# Patient Record
Sex: Male | Born: 1945
Health system: Southern US, Community
[De-identification: ages and names within clinical notes are randomized; demographics above are authoritative.]

## PROBLEM LIST (undated history)

## (undated) DIAGNOSIS — I10 Essential (primary) hypertension: Secondary | ICD-10-CM

## (undated) HISTORY — DX: Essential (primary) hypertension: I10

---

## 2004-10-28 ENCOUNTER — Encounter: Admission: RE | Admit: 2004-10-28 | Discharge: 2004-10-28 | Payer: Self-pay | Admitting: Gastroenterology

## 2009-11-16 ENCOUNTER — Encounter: Admission: RE | Admit: 2009-11-16 | Discharge: 2009-11-16 | Payer: Self-pay | Admitting: Family Medicine

## 2009-11-23 ENCOUNTER — Encounter: Admission: RE | Admit: 2009-11-23 | Discharge: 2009-11-23 | Payer: Self-pay | Admitting: Neurosurgery

## 2009-12-21 ENCOUNTER — Encounter: Admission: RE | Admit: 2009-12-21 | Discharge: 2009-12-21 | Payer: Self-pay | Admitting: Neurosurgery

## 2010-01-29 ENCOUNTER — Encounter: Admission: RE | Admit: 2010-01-29 | Discharge: 2010-01-29 | Payer: Self-pay | Admitting: Neurosurgery

## 2010-05-07 ENCOUNTER — Observation Stay (HOSPITAL_COMMUNITY): Admission: RE | Admit: 2010-05-07 | Discharge: 2010-05-08 | Payer: Self-pay | Admitting: Neurosurgery

## 2010-06-18 ENCOUNTER — Emergency Department (HOSPITAL_COMMUNITY): Admission: EM | Admit: 2010-06-18 | Discharge: 2010-06-19 | Payer: Self-pay | Admitting: Emergency Medicine

## 2010-06-20 ENCOUNTER — Ambulatory Visit (HOSPITAL_COMMUNITY): Admission: RE | Admit: 2010-06-20 | Discharge: 2010-06-20 | Payer: Self-pay | Admitting: Gastroenterology

## 2010-12-26 LAB — CBC
Hemoglobin: 13 g/dL (ref 13.0–17.0)
MCH: 29.5 pg (ref 26.0–34.0)
MCHC: 34.9 g/dL (ref 30.0–36.0)
Platelets: 286 10*3/uL (ref 150–400)
RDW: 12.4 % (ref 11.5–15.5)

## 2010-12-26 LAB — PROTIME-INR
INR: 0.95 (ref 0.00–1.49)
Prothrombin Time: 12.9 seconds (ref 11.6–15.2)

## 2010-12-26 LAB — COMPREHENSIVE METABOLIC PANEL
AST: 21 U/L (ref 0–37)
CO2: 28 mEq/L (ref 19–32)
Calcium: 9.3 mg/dL (ref 8.4–10.5)
Creatinine, Ser: 0.95 mg/dL (ref 0.4–1.5)
GFR calc Af Amer: >60 mL/min (ref >60)
GFR calc non Af Amer: >60 mL/min (ref >60)
Total Protein: 6.3 g/dL (ref 6.0–8.3)

## 2010-12-26 LAB — GLUCOSE, CAPILLARY: Glucose-Capillary: 242 mg/dL — ABNORMAL HIGH (ref 70–99)

## 2010-12-28 LAB — CBC
HCT: 41 % (ref 39.0–52.0)
Hemoglobin: 13.9 g/dL (ref 13.0–17.0)
MCH: 32 pg (ref 26.0–34.0)
MCHC: 33.9 g/dL (ref 30.0–36.0)
RDW: 12.7 % (ref 11.5–15.5)

## 2010-12-28 LAB — COMPREHENSIVE METABOLIC PANEL
ALT: 34 U/L (ref 0–53)
CO2: 28 mEq/L (ref 19–32)
Calcium: 9.7 mg/dL (ref 8.4–10.5)
Creatinine, Ser: 1.04 mg/dL (ref 0.4–1.5)
GFR calc non Af Amer: >60 mL/min (ref >60)
Glucose, Bld: 113 mg/dL — ABNORMAL HIGH (ref 70–99)
Sodium: 138 mEq/L (ref 135–145)
Total Protein: 6.7 g/dL (ref 6.0–8.3)

## 2010-12-28 LAB — GLUCOSE, CAPILLARY
Glucose-Capillary: 149 mg/dL — ABNORMAL HIGH (ref 70–99)
Glucose-Capillary: 187 mg/dL — ABNORMAL HIGH (ref 70–99)

## 2010-12-28 LAB — APTT: aPTT: 26 seconds (ref 24–37)

## 2010-12-28 LAB — PROTIME-INR
INR: 0.93 (ref 0.00–1.49)
Prothrombin Time: 12.4 seconds (ref 11.6–15.2)

## 2010-12-28 LAB — DIFFERENTIAL
Eosinophils Absolute: 0.2 10*3/uL (ref 0.0–0.7)
Lymphocytes Relative: 16 % (ref 12–46)
Lymphs Abs: 1.3 10*3/uL (ref 0.7–4.0)
Monocytes Relative: 8 % (ref 3–12)
Neutro Abs: 6.1 10*3/uL (ref 1.7–7.7)
Neutrophils Relative %: 73 % (ref 43–77)

## 2010-12-28 LAB — URINALYSIS, ROUTINE W REFLEX MICROSCOPIC
Bilirubin Urine: NEGATIVE
Ketones, ur: NEGATIVE mg/dL
Nitrite: NEGATIVE
Protein, ur: NEGATIVE mg/dL
pH: 5.5 (ref 5.0–8.0)

## 2010-12-28 LAB — SURGICAL PCR SCREEN
MRSA, PCR: NEGATIVE
Staphylococcus aureus: NEGATIVE

## 2011-07-09 DIAGNOSIS — I10 Essential (primary) hypertension: Secondary | ICD-10-CM | POA: Insufficient documentation

## 2011-07-09 DIAGNOSIS — F909 Attention-deficit hyperactivity disorder, unspecified type: Secondary | ICD-10-CM | POA: Insufficient documentation

## 2011-10-15 DIAGNOSIS — I10 Essential (primary) hypertension: Secondary | ICD-10-CM | POA: Diagnosis not present

## 2011-10-15 DIAGNOSIS — E119 Type 2 diabetes mellitus without complications: Secondary | ICD-10-CM | POA: Diagnosis not present

## 2011-10-15 DIAGNOSIS — Z125 Encounter for screening for malignant neoplasm of prostate: Secondary | ICD-10-CM | POA: Diagnosis not present

## 2011-10-15 DIAGNOSIS — Z0000 Encounter for general adult medical examination without abnormal findings: Secondary | ICD-10-CM | POA: Diagnosis not present

## 2011-10-15 DIAGNOSIS — E785 Hyperlipidemia, unspecified: Secondary | ICD-10-CM | POA: Diagnosis not present

## 2011-10-22 DIAGNOSIS — R079 Chest pain, unspecified: Secondary | ICD-10-CM | POA: Diagnosis not present

## 2011-10-22 DIAGNOSIS — I10 Essential (primary) hypertension: Secondary | ICD-10-CM | POA: Diagnosis not present

## 2011-10-22 DIAGNOSIS — Z125 Encounter for screening for malignant neoplasm of prostate: Secondary | ICD-10-CM | POA: Diagnosis not present

## 2011-10-22 DIAGNOSIS — J309 Allergic rhinitis, unspecified: Secondary | ICD-10-CM | POA: Diagnosis not present

## 2011-10-22 DIAGNOSIS — Z0000 Encounter for general adult medical examination without abnormal findings: Secondary | ICD-10-CM | POA: Diagnosis not present

## 2011-10-22 DIAGNOSIS — N529 Male erectile dysfunction, unspecified: Secondary | ICD-10-CM | POA: Insufficient documentation

## 2011-12-23 DIAGNOSIS — J209 Acute bronchitis, unspecified: Secondary | ICD-10-CM | POA: Diagnosis not present

## 2012-02-02 DIAGNOSIS — B351 Tinea unguium: Secondary | ICD-10-CM | POA: Diagnosis not present

## 2012-02-02 DIAGNOSIS — E119 Type 2 diabetes mellitus without complications: Secondary | ICD-10-CM | POA: Diagnosis not present

## 2012-02-02 DIAGNOSIS — I10 Essential (primary) hypertension: Secondary | ICD-10-CM | POA: Diagnosis not present

## 2012-02-02 DIAGNOSIS — E785 Hyperlipidemia, unspecified: Secondary | ICD-10-CM | POA: Diagnosis not present

## 2012-02-02 DIAGNOSIS — N400 Enlarged prostate without lower urinary tract symptoms: Secondary | ICD-10-CM | POA: Insufficient documentation

## 2012-02-17 DIAGNOSIS — M2020 Hallux rigidus, unspecified foot: Secondary | ICD-10-CM | POA: Diagnosis not present

## 2012-02-20 DIAGNOSIS — N529 Male erectile dysfunction, unspecified: Secondary | ICD-10-CM | POA: Diagnosis not present

## 2012-02-20 DIAGNOSIS — E291 Testicular hypofunction: Secondary | ICD-10-CM | POA: Diagnosis not present

## 2012-05-19 DIAGNOSIS — M1990 Unspecified osteoarthritis, unspecified site: Secondary | ICD-10-CM | POA: Diagnosis not present

## 2012-05-19 DIAGNOSIS — E119 Type 2 diabetes mellitus without complications: Secondary | ICD-10-CM | POA: Diagnosis not present

## 2012-05-19 DIAGNOSIS — N529 Male erectile dysfunction, unspecified: Secondary | ICD-10-CM | POA: Diagnosis not present

## 2012-05-19 DIAGNOSIS — N400 Enlarged prostate without lower urinary tract symptoms: Secondary | ICD-10-CM | POA: Diagnosis not present

## 2012-06-29 DIAGNOSIS — M79609 Pain in unspecified limb: Secondary | ICD-10-CM | POA: Diagnosis not present

## 2012-06-29 DIAGNOSIS — M2020 Hallux rigidus, unspecified foot: Secondary | ICD-10-CM | POA: Diagnosis not present

## 2012-10-28 DIAGNOSIS — Z125 Encounter for screening for malignant neoplasm of prostate: Secondary | ICD-10-CM | POA: Diagnosis not present

## 2012-10-28 DIAGNOSIS — I10 Essential (primary) hypertension: Secondary | ICD-10-CM | POA: Diagnosis not present

## 2012-10-28 DIAGNOSIS — E785 Hyperlipidemia, unspecified: Secondary | ICD-10-CM | POA: Diagnosis not present

## 2012-10-28 DIAGNOSIS — E119 Type 2 diabetes mellitus without complications: Secondary | ICD-10-CM | POA: Diagnosis not present

## 2012-10-29 DIAGNOSIS — E785 Hyperlipidemia, unspecified: Secondary | ICD-10-CM | POA: Diagnosis not present

## 2012-12-27 DIAGNOSIS — Z1212 Encounter for screening for malignant neoplasm of rectum: Secondary | ICD-10-CM | POA: Diagnosis not present

## 2012-12-27 DIAGNOSIS — Z125 Encounter for screening for malignant neoplasm of prostate: Secondary | ICD-10-CM | POA: Diagnosis not present

## 2012-12-27 DIAGNOSIS — J309 Allergic rhinitis, unspecified: Secondary | ICD-10-CM | POA: Diagnosis not present

## 2012-12-27 DIAGNOSIS — N529 Male erectile dysfunction, unspecified: Secondary | ICD-10-CM | POA: Diagnosis not present

## 2012-12-27 DIAGNOSIS — Z0000 Encounter for general adult medical examination without abnormal findings: Secondary | ICD-10-CM | POA: Diagnosis not present

## 2012-12-27 DIAGNOSIS — M1990 Unspecified osteoarthritis, unspecified site: Secondary | ICD-10-CM | POA: Diagnosis not present

## 2012-12-27 DIAGNOSIS — E119 Type 2 diabetes mellitus without complications: Secondary | ICD-10-CM | POA: Diagnosis not present

## 2012-12-27 DIAGNOSIS — F909 Attention-deficit hyperactivity disorder, unspecified type: Secondary | ICD-10-CM | POA: Diagnosis not present

## 2012-12-27 DIAGNOSIS — E785 Hyperlipidemia, unspecified: Secondary | ICD-10-CM | POA: Diagnosis not present

## 2013-04-18 DIAGNOSIS — B0229 Other postherpetic nervous system involvement: Secondary | ICD-10-CM | POA: Diagnosis not present

## 2013-04-18 DIAGNOSIS — B029 Zoster without complications: Secondary | ICD-10-CM | POA: Diagnosis not present

## 2013-04-18 DIAGNOSIS — E119 Type 2 diabetes mellitus without complications: Secondary | ICD-10-CM | POA: Diagnosis not present

## 2013-04-18 DIAGNOSIS — I10 Essential (primary) hypertension: Secondary | ICD-10-CM | POA: Diagnosis not present

## 2013-04-18 DIAGNOSIS — IMO0002 Reserved for concepts with insufficient information to code with codable children: Secondary | ICD-10-CM | POA: Diagnosis not present

## 2013-11-08 DIAGNOSIS — J309 Allergic rhinitis, unspecified: Secondary | ICD-10-CM | POA: Diagnosis not present

## 2013-11-08 DIAGNOSIS — J069 Acute upper respiratory infection, unspecified: Secondary | ICD-10-CM | POA: Diagnosis not present

## 2013-11-08 DIAGNOSIS — IMO0002 Reserved for concepts with insufficient information to code with codable children: Secondary | ICD-10-CM | POA: Diagnosis not present

## 2013-12-20 DIAGNOSIS — E785 Hyperlipidemia, unspecified: Secondary | ICD-10-CM | POA: Diagnosis not present

## 2013-12-20 DIAGNOSIS — E1169 Type 2 diabetes mellitus with other specified complication: Secondary | ICD-10-CM | POA: Diagnosis not present

## 2013-12-20 DIAGNOSIS — Z125 Encounter for screening for malignant neoplasm of prostate: Secondary | ICD-10-CM | POA: Diagnosis not present

## 2013-12-29 DIAGNOSIS — E785 Hyperlipidemia, unspecified: Secondary | ICD-10-CM | POA: Diagnosis not present

## 2013-12-29 DIAGNOSIS — N529 Male erectile dysfunction, unspecified: Secondary | ICD-10-CM | POA: Diagnosis not present

## 2013-12-29 DIAGNOSIS — N400 Enlarged prostate without lower urinary tract symptoms: Secondary | ICD-10-CM | POA: Diagnosis not present

## 2013-12-29 DIAGNOSIS — Z1331 Encounter for screening for depression: Secondary | ICD-10-CM | POA: Diagnosis not present

## 2013-12-29 DIAGNOSIS — F909 Attention-deficit hyperactivity disorder, unspecified type: Secondary | ICD-10-CM | POA: Diagnosis not present

## 2013-12-29 DIAGNOSIS — I10 Essential (primary) hypertension: Secondary | ICD-10-CM | POA: Diagnosis not present

## 2013-12-29 DIAGNOSIS — E1169 Type 2 diabetes mellitus with other specified complication: Secondary | ICD-10-CM | POA: Diagnosis not present

## 2013-12-29 DIAGNOSIS — Z1212 Encounter for screening for malignant neoplasm of rectum: Secondary | ICD-10-CM | POA: Diagnosis not present

## 2013-12-29 DIAGNOSIS — Z0000 Encounter for general adult medical examination without abnormal findings: Secondary | ICD-10-CM | POA: Diagnosis not present

## 2013-12-29 DIAGNOSIS — M1990 Unspecified osteoarthritis, unspecified site: Secondary | ICD-10-CM | POA: Diagnosis not present

## 2013-12-29 DIAGNOSIS — K219 Gastro-esophageal reflux disease without esophagitis: Secondary | ICD-10-CM | POA: Insufficient documentation

## 2013-12-29 DIAGNOSIS — Z125 Encounter for screening for malignant neoplasm of prostate: Secondary | ICD-10-CM | POA: Diagnosis not present

## 2014-07-11 DIAGNOSIS — Z6831 Body mass index (BMI) 31.0-31.9, adult: Secondary | ICD-10-CM | POA: Diagnosis not present

## 2014-07-11 DIAGNOSIS — M1990 Unspecified osteoarthritis, unspecified site: Secondary | ICD-10-CM | POA: Diagnosis not present

## 2014-07-11 DIAGNOSIS — R319 Hematuria, unspecified: Secondary | ICD-10-CM | POA: Diagnosis not present

## 2014-07-11 DIAGNOSIS — E1169 Type 2 diabetes mellitus with other specified complication: Secondary | ICD-10-CM | POA: Diagnosis not present

## 2014-07-11 DIAGNOSIS — Z23 Encounter for immunization: Secondary | ICD-10-CM | POA: Diagnosis not present

## 2014-07-11 DIAGNOSIS — I10 Essential (primary) hypertension: Secondary | ICD-10-CM | POA: Diagnosis not present

## 2014-07-11 DIAGNOSIS — K625 Hemorrhage of anus and rectum: Secondary | ICD-10-CM | POA: Diagnosis not present

## 2014-08-29 DIAGNOSIS — R300 Dysuria: Secondary | ICD-10-CM | POA: Diagnosis not present

## 2014-08-29 DIAGNOSIS — K219 Gastro-esophageal reflux disease without esophagitis: Secondary | ICD-10-CM | POA: Diagnosis not present

## 2014-08-29 DIAGNOSIS — K921 Melena: Secondary | ICD-10-CM | POA: Diagnosis not present

## 2014-10-10 DIAGNOSIS — J069 Acute upper respiratory infection, unspecified: Secondary | ICD-10-CM | POA: Diagnosis not present

## 2015-03-05 DIAGNOSIS — K640 First degree hemorrhoids: Secondary | ICD-10-CM | POA: Diagnosis not present

## 2015-03-05 DIAGNOSIS — Z1211 Encounter for screening for malignant neoplasm of colon: Secondary | ICD-10-CM | POA: Diagnosis not present

## 2015-03-05 DIAGNOSIS — K219 Gastro-esophageal reflux disease without esophagitis: Secondary | ICD-10-CM | POA: Diagnosis not present

## 2015-03-05 DIAGNOSIS — R12 Heartburn: Secondary | ICD-10-CM | POA: Diagnosis not present

## 2015-03-05 DIAGNOSIS — K5730 Diverticulosis of large intestine without perforation or abscess without bleeding: Secondary | ICD-10-CM | POA: Diagnosis not present

## 2015-04-24 DIAGNOSIS — E785 Hyperlipidemia, unspecified: Secondary | ICD-10-CM | POA: Diagnosis not present

## 2015-04-24 DIAGNOSIS — E119 Type 2 diabetes mellitus without complications: Secondary | ICD-10-CM | POA: Diagnosis not present

## 2015-04-24 DIAGNOSIS — I10 Essential (primary) hypertension: Secondary | ICD-10-CM | POA: Diagnosis not present

## 2015-04-24 DIAGNOSIS — Z125 Encounter for screening for malignant neoplasm of prostate: Secondary | ICD-10-CM | POA: Diagnosis not present

## 2015-05-02 DIAGNOSIS — E785 Hyperlipidemia, unspecified: Secondary | ICD-10-CM | POA: Diagnosis not present

## 2015-05-02 DIAGNOSIS — M1990 Unspecified osteoarthritis, unspecified site: Secondary | ICD-10-CM | POA: Diagnosis not present

## 2015-05-02 DIAGNOSIS — Z6821 Body mass index (BMI) 21.0-21.9, adult: Secondary | ICD-10-CM | POA: Diagnosis not present

## 2015-05-02 DIAGNOSIS — Z23 Encounter for immunization: Secondary | ICD-10-CM | POA: Diagnosis not present

## 2015-05-02 DIAGNOSIS — F909 Attention-deficit hyperactivity disorder, unspecified type: Secondary | ICD-10-CM | POA: Diagnosis not present

## 2015-05-02 DIAGNOSIS — N183 Chronic kidney disease, stage 3 (moderate): Secondary | ICD-10-CM | POA: Diagnosis not present

## 2015-05-02 DIAGNOSIS — I499 Cardiac arrhythmia, unspecified: Secondary | ICD-10-CM | POA: Diagnosis not present

## 2015-05-02 DIAGNOSIS — Z1389 Encounter for screening for other disorder: Secondary | ICD-10-CM | POA: Diagnosis not present

## 2015-05-02 DIAGNOSIS — Z0000 Encounter for general adult medical examination without abnormal findings: Secondary | ICD-10-CM | POA: Diagnosis not present

## 2015-05-02 DIAGNOSIS — E1129 Type 2 diabetes mellitus with other diabetic kidney complication: Secondary | ICD-10-CM | POA: Diagnosis not present

## 2015-05-02 DIAGNOSIS — K219 Gastro-esophageal reflux disease without esophagitis: Secondary | ICD-10-CM | POA: Diagnosis not present

## 2015-05-02 DIAGNOSIS — N400 Enlarged prostate without lower urinary tract symptoms: Secondary | ICD-10-CM | POA: Diagnosis not present

## 2015-05-02 DIAGNOSIS — I10 Essential (primary) hypertension: Secondary | ICD-10-CM | POA: Diagnosis not present

## 2015-05-02 DIAGNOSIS — E1121 Type 2 diabetes mellitus with diabetic nephropathy: Secondary | ICD-10-CM | POA: Insufficient documentation

## 2015-05-03 DIAGNOSIS — Z1212 Encounter for screening for malignant neoplasm of rectum: Secondary | ICD-10-CM | POA: Diagnosis not present

## 2015-05-10 DIAGNOSIS — R079 Chest pain, unspecified: Secondary | ICD-10-CM | POA: Diagnosis not present

## 2015-05-10 DIAGNOSIS — I10 Essential (primary) hypertension: Secondary | ICD-10-CM | POA: Diagnosis not present

## 2015-05-10 DIAGNOSIS — Z6821 Body mass index (BMI) 21.0-21.9, adult: Secondary | ICD-10-CM | POA: Diagnosis not present

## 2015-05-10 DIAGNOSIS — R0781 Pleurodynia: Secondary | ICD-10-CM | POA: Diagnosis not present

## 2015-05-10 DIAGNOSIS — E1129 Type 2 diabetes mellitus with other diabetic kidney complication: Secondary | ICD-10-CM | POA: Diagnosis not present

## 2015-05-10 DIAGNOSIS — S299XXA Unspecified injury of thorax, initial encounter: Secondary | ICD-10-CM | POA: Diagnosis not present

## 2015-08-16 DIAGNOSIS — Z23 Encounter for immunization: Secondary | ICD-10-CM | POA: Diagnosis not present

## 2015-08-30 DIAGNOSIS — N183 Chronic kidney disease, stage 3 (moderate): Secondary | ICD-10-CM | POA: Diagnosis not present

## 2015-08-30 DIAGNOSIS — E1129 Type 2 diabetes mellitus with other diabetic kidney complication: Secondary | ICD-10-CM | POA: Diagnosis not present

## 2015-08-30 DIAGNOSIS — F909 Attention-deficit hyperactivity disorder, unspecified type: Secondary | ICD-10-CM | POA: Diagnosis not present

## 2015-08-30 DIAGNOSIS — Z6821 Body mass index (BMI) 21.0-21.9, adult: Secondary | ICD-10-CM | POA: Diagnosis not present

## 2015-08-30 DIAGNOSIS — I10 Essential (primary) hypertension: Secondary | ICD-10-CM | POA: Diagnosis not present

## 2015-09-25 DIAGNOSIS — Z6820 Body mass index (BMI) 20.0-20.9, adult: Secondary | ICD-10-CM | POA: Diagnosis not present

## 2015-09-25 DIAGNOSIS — J029 Acute pharyngitis, unspecified: Secondary | ICD-10-CM | POA: Diagnosis not present

## 2015-10-09 DIAGNOSIS — R590 Localized enlarged lymph nodes: Secondary | ICD-10-CM | POA: Diagnosis not present

## 2015-10-09 DIAGNOSIS — J029 Acute pharyngitis, unspecified: Secondary | ICD-10-CM | POA: Diagnosis not present

## 2015-10-09 DIAGNOSIS — Z6820 Body mass index (BMI) 20.0-20.9, adult: Secondary | ICD-10-CM | POA: Diagnosis not present

## 2015-10-09 DIAGNOSIS — R5383 Other fatigue: Secondary | ICD-10-CM | POA: Diagnosis not present

## 2015-10-10 ENCOUNTER — Other Ambulatory Visit: Payer: Self-pay | Admitting: Internal Medicine

## 2015-10-10 DIAGNOSIS — R590 Localized enlarged lymph nodes: Secondary | ICD-10-CM

## 2015-10-18 ENCOUNTER — Other Ambulatory Visit: Payer: Self-pay

## 2015-10-19 ENCOUNTER — Ambulatory Visit
Admission: RE | Admit: 2015-10-19 | Discharge: 2015-10-19 | Disposition: A | Discharge status: Home / Self Care | Payer: BLUE CROSS/BLUE SHIELD | Source: Ambulatory Visit | Attending: Internal Medicine | Admitting: Internal Medicine

## 2015-10-19 DIAGNOSIS — R221 Localized swelling, mass and lump, neck: Secondary | ICD-10-CM | POA: Diagnosis not present

## 2015-10-19 DIAGNOSIS — R590 Localized enlarged lymph nodes: Secondary | ICD-10-CM

## 2015-10-19 MED ORDER — IOPAMIDOL (ISOVUE-300) INJECTION 61%
75.0000 mL | Freq: Once | INTRAVENOUS | Status: AC | PRN
  Administered 2015-10-19: 75 mL via INTRAVENOUS

## 2015-10-29 DIAGNOSIS — R590 Localized enlarged lymph nodes: Secondary | ICD-10-CM | POA: Diagnosis not present

## 2015-11-19 DIAGNOSIS — R590 Localized enlarged lymph nodes: Secondary | ICD-10-CM | POA: Diagnosis not present

## 2015-11-22 DIAGNOSIS — Z6820 Body mass index (BMI) 20.0-20.9, adult: Secondary | ICD-10-CM | POA: Diagnosis not present

## 2015-11-22 DIAGNOSIS — R05 Cough: Secondary | ICD-10-CM | POA: Diagnosis not present

## 2015-11-22 DIAGNOSIS — J209 Acute bronchitis, unspecified: Secondary | ICD-10-CM | POA: Diagnosis not present

## 2016-04-16 DIAGNOSIS — E1165 Type 2 diabetes mellitus with hyperglycemia: Secondary | ICD-10-CM | POA: Diagnosis not present

## 2016-04-16 DIAGNOSIS — E7800 Pure hypercholesterolemia, unspecified: Secondary | ICD-10-CM | POA: Diagnosis not present

## 2016-04-16 DIAGNOSIS — I10 Essential (primary) hypertension: Secondary | ICD-10-CM | POA: Diagnosis not present

## 2016-05-26 DIAGNOSIS — Z1212 Encounter for screening for malignant neoplasm of rectum: Secondary | ICD-10-CM | POA: Diagnosis not present

## 2016-05-26 DIAGNOSIS — E784 Other hyperlipidemia: Secondary | ICD-10-CM | POA: Diagnosis not present

## 2016-05-26 DIAGNOSIS — E1129 Type 2 diabetes mellitus with other diabetic kidney complication: Secondary | ICD-10-CM | POA: Diagnosis not present

## 2016-05-26 DIAGNOSIS — I10 Essential (primary) hypertension: Secondary | ICD-10-CM | POA: Diagnosis not present

## 2016-05-26 DIAGNOSIS — Z125 Encounter for screening for malignant neoplasm of prostate: Secondary | ICD-10-CM | POA: Diagnosis not present

## 2016-06-02 DIAGNOSIS — E784 Other hyperlipidemia: Secondary | ICD-10-CM | POA: Diagnosis not present

## 2016-06-02 DIAGNOSIS — I10 Essential (primary) hypertension: Secondary | ICD-10-CM | POA: Diagnosis not present

## 2016-06-02 DIAGNOSIS — Z23 Encounter for immunization: Secondary | ICD-10-CM | POA: Diagnosis not present

## 2016-06-02 DIAGNOSIS — J309 Allergic rhinitis, unspecified: Secondary | ICD-10-CM | POA: Diagnosis not present

## 2016-06-02 DIAGNOSIS — R197 Diarrhea, unspecified: Secondary | ICD-10-CM | POA: Diagnosis not present

## 2016-06-02 DIAGNOSIS — N183 Chronic kidney disease, stage 3 (moderate): Secondary | ICD-10-CM | POA: Diagnosis not present

## 2016-06-02 DIAGNOSIS — K219 Gastro-esophageal reflux disease without esophagitis: Secondary | ICD-10-CM | POA: Diagnosis not present

## 2016-06-02 DIAGNOSIS — Z0000 Encounter for general adult medical examination without abnormal findings: Secondary | ICD-10-CM | POA: Diagnosis not present

## 2016-06-02 DIAGNOSIS — E1129 Type 2 diabetes mellitus with other diabetic kidney complication: Secondary | ICD-10-CM | POA: Diagnosis not present

## 2016-06-02 DIAGNOSIS — Z1389 Encounter for screening for other disorder: Secondary | ICD-10-CM | POA: Diagnosis not present

## 2016-06-02 DIAGNOSIS — F909 Attention-deficit hyperactivity disorder, unspecified type: Secondary | ICD-10-CM | POA: Diagnosis not present

## 2016-06-02 DIAGNOSIS — M1990 Unspecified osteoarthritis, unspecified site: Secondary | ICD-10-CM | POA: Diagnosis not present

## 2016-06-02 DIAGNOSIS — N400 Enlarged prostate without lower urinary tract symptoms: Secondary | ICD-10-CM | POA: Diagnosis not present

## 2016-07-21 DIAGNOSIS — Z23 Encounter for immunization: Secondary | ICD-10-CM | POA: Diagnosis not present

## 2016-07-21 DIAGNOSIS — Z6821 Body mass index (BMI) 21.0-21.9, adult: Secondary | ICD-10-CM | POA: Diagnosis not present

## 2016-07-21 DIAGNOSIS — E1129 Type 2 diabetes mellitus with other diabetic kidney complication: Secondary | ICD-10-CM | POA: Diagnosis not present

## 2016-07-21 DIAGNOSIS — I10 Essential (primary) hypertension: Secondary | ICD-10-CM | POA: Diagnosis not present

## 2016-08-15 DIAGNOSIS — E1165 Type 2 diabetes mellitus with hyperglycemia: Secondary | ICD-10-CM | POA: Diagnosis not present

## 2016-08-15 DIAGNOSIS — E7800 Pure hypercholesterolemia, unspecified: Secondary | ICD-10-CM | POA: Diagnosis not present

## 2016-08-20 DIAGNOSIS — E7800 Pure hypercholesterolemia, unspecified: Secondary | ICD-10-CM | POA: Diagnosis not present

## 2016-08-20 DIAGNOSIS — I10 Essential (primary) hypertension: Secondary | ICD-10-CM | POA: Diagnosis not present

## 2016-08-20 DIAGNOSIS — E1165 Type 2 diabetes mellitus with hyperglycemia: Secondary | ICD-10-CM | POA: Diagnosis not present

## 2016-09-15 DIAGNOSIS — Z6822 Body mass index (BMI) 22.0-22.9, adult: Secondary | ICD-10-CM | POA: Diagnosis not present

## 2016-09-15 DIAGNOSIS — R05 Cough: Secondary | ICD-10-CM | POA: Diagnosis not present

## 2016-09-15 DIAGNOSIS — J209 Acute bronchitis, unspecified: Secondary | ICD-10-CM | POA: Diagnosis not present

## 2016-12-15 DIAGNOSIS — Z6821 Body mass index (BMI) 21.0-21.9, adult: Secondary | ICD-10-CM | POA: Diagnosis not present

## 2016-12-15 DIAGNOSIS — E784 Other hyperlipidemia: Secondary | ICD-10-CM | POA: Diagnosis not present

## 2016-12-15 DIAGNOSIS — I10 Essential (primary) hypertension: Secondary | ICD-10-CM | POA: Diagnosis not present

## 2016-12-15 DIAGNOSIS — F909 Attention-deficit hyperactivity disorder, unspecified type: Secondary | ICD-10-CM | POA: Diagnosis not present

## 2016-12-15 DIAGNOSIS — J309 Allergic rhinitis, unspecified: Secondary | ICD-10-CM | POA: Diagnosis not present

## 2016-12-15 DIAGNOSIS — Z1389 Encounter for screening for other disorder: Secondary | ICD-10-CM | POA: Diagnosis not present

## 2016-12-15 DIAGNOSIS — E1129 Type 2 diabetes mellitus with other diabetic kidney complication: Secondary | ICD-10-CM | POA: Diagnosis not present

## 2016-12-15 DIAGNOSIS — N183 Chronic kidney disease, stage 3 (moderate): Secondary | ICD-10-CM | POA: Diagnosis not present

## 2017-01-22 DIAGNOSIS — I10 Essential (primary) hypertension: Secondary | ICD-10-CM | POA: Diagnosis not present

## 2017-01-22 DIAGNOSIS — E1165 Type 2 diabetes mellitus with hyperglycemia: Secondary | ICD-10-CM | POA: Diagnosis not present

## 2017-01-22 DIAGNOSIS — E7800 Pure hypercholesterolemia, unspecified: Secondary | ICD-10-CM | POA: Diagnosis not present

## 2017-04-21 DIAGNOSIS — M1990 Unspecified osteoarthritis, unspecified site: Secondary | ICD-10-CM | POA: Diagnosis not present

## 2017-04-21 DIAGNOSIS — Z6822 Body mass index (BMI) 22.0-22.9, adult: Secondary | ICD-10-CM | POA: Diagnosis not present

## 2017-04-21 DIAGNOSIS — I10 Essential (primary) hypertension: Secondary | ICD-10-CM | POA: Diagnosis not present

## 2017-04-21 DIAGNOSIS — F430 Acute stress reaction: Secondary | ICD-10-CM | POA: Insufficient documentation

## 2017-04-21 DIAGNOSIS — L989 Disorder of the skin and subcutaneous tissue, unspecified: Secondary | ICD-10-CM | POA: Diagnosis not present

## 2017-04-21 DIAGNOSIS — J309 Allergic rhinitis, unspecified: Secondary | ICD-10-CM | POA: Diagnosis not present

## 2017-04-21 DIAGNOSIS — E1129 Type 2 diabetes mellitus with other diabetic kidney complication: Secondary | ICD-10-CM | POA: Diagnosis not present

## 2017-04-29 DIAGNOSIS — L988 Other specified disorders of the skin and subcutaneous tissue: Secondary | ICD-10-CM | POA: Diagnosis not present

## 2017-04-29 DIAGNOSIS — Z6822 Body mass index (BMI) 22.0-22.9, adult: Secondary | ICD-10-CM | POA: Diagnosis not present

## 2017-04-29 DIAGNOSIS — J029 Acute pharyngitis, unspecified: Secondary | ICD-10-CM | POA: Diagnosis not present

## 2017-04-29 DIAGNOSIS — I10 Essential (primary) hypertension: Secondary | ICD-10-CM | POA: Diagnosis not present

## 2017-05-15 DIAGNOSIS — I8390 Asymptomatic varicose veins of unspecified lower extremity: Secondary | ICD-10-CM | POA: Diagnosis not present

## 2017-05-15 DIAGNOSIS — M79662 Pain in left lower leg: Secondary | ICD-10-CM | POA: Diagnosis not present

## 2017-05-15 DIAGNOSIS — Z6822 Body mass index (BMI) 22.0-22.9, adult: Secondary | ICD-10-CM | POA: Diagnosis not present

## 2017-06-25 DIAGNOSIS — I10 Essential (primary) hypertension: Secondary | ICD-10-CM | POA: Diagnosis not present

## 2017-06-25 DIAGNOSIS — E784 Other hyperlipidemia: Secondary | ICD-10-CM | POA: Diagnosis not present

## 2017-06-25 DIAGNOSIS — E1129 Type 2 diabetes mellitus with other diabetic kidney complication: Secondary | ICD-10-CM | POA: Diagnosis not present

## 2017-06-25 DIAGNOSIS — Z125 Encounter for screening for malignant neoplasm of prostate: Secondary | ICD-10-CM | POA: Diagnosis not present

## 2017-07-15 DIAGNOSIS — Z1212 Encounter for screening for malignant neoplasm of rectum: Secondary | ICD-10-CM | POA: Diagnosis not present

## 2017-07-23 DIAGNOSIS — Z1389 Encounter for screening for other disorder: Secondary | ICD-10-CM | POA: Diagnosis not present

## 2017-07-23 DIAGNOSIS — E7849 Other hyperlipidemia: Secondary | ICD-10-CM | POA: Diagnosis not present

## 2017-07-23 DIAGNOSIS — N183 Chronic kidney disease, stage 3 (moderate): Secondary | ICD-10-CM | POA: Diagnosis not present

## 2017-07-23 DIAGNOSIS — I10 Essential (primary) hypertension: Secondary | ICD-10-CM | POA: Diagnosis not present

## 2017-07-23 DIAGNOSIS — F909 Attention-deficit hyperactivity disorder, unspecified type: Secondary | ICD-10-CM | POA: Diagnosis not present

## 2017-07-23 DIAGNOSIS — F430 Acute stress reaction: Secondary | ICD-10-CM | POA: Diagnosis not present

## 2017-07-23 DIAGNOSIS — Z0000 Encounter for general adult medical examination without abnormal findings: Secondary | ICD-10-CM | POA: Diagnosis not present

## 2017-07-23 DIAGNOSIS — J3089 Other allergic rhinitis: Secondary | ICD-10-CM | POA: Diagnosis not present

## 2017-07-23 DIAGNOSIS — Z23 Encounter for immunization: Secondary | ICD-10-CM | POA: Diagnosis not present

## 2017-07-23 DIAGNOSIS — I8390 Asymptomatic varicose veins of unspecified lower extremity: Secondary | ICD-10-CM | POA: Diagnosis not present

## 2017-07-23 DIAGNOSIS — Z6822 Body mass index (BMI) 22.0-22.9, adult: Secondary | ICD-10-CM | POA: Diagnosis not present

## 2017-07-23 DIAGNOSIS — E1129 Type 2 diabetes mellitus with other diabetic kidney complication: Secondary | ICD-10-CM | POA: Diagnosis not present

## 2017-10-26 DIAGNOSIS — Z794 Long term (current) use of insulin: Secondary | ICD-10-CM | POA: Insufficient documentation

## 2017-11-02 DIAGNOSIS — Z6822 Body mass index (BMI) 22.0-22.9, adult: Secondary | ICD-10-CM | POA: Diagnosis not present

## 2017-11-02 DIAGNOSIS — R05 Cough: Secondary | ICD-10-CM | POA: Diagnosis not present

## 2017-11-02 DIAGNOSIS — J309 Allergic rhinitis, unspecified: Secondary | ICD-10-CM | POA: Diagnosis not present

## 2017-11-02 DIAGNOSIS — J209 Acute bronchitis, unspecified: Secondary | ICD-10-CM | POA: Diagnosis not present

## 2017-12-01 DIAGNOSIS — F430 Acute stress reaction: Secondary | ICD-10-CM | POA: Diagnosis not present

## 2017-12-01 DIAGNOSIS — I10 Essential (primary) hypertension: Secondary | ICD-10-CM | POA: Diagnosis not present

## 2017-12-01 DIAGNOSIS — Z6822 Body mass index (BMI) 22.0-22.9, adult: Secondary | ICD-10-CM | POA: Diagnosis not present

## 2017-12-01 DIAGNOSIS — E1129 Type 2 diabetes mellitus with other diabetic kidney complication: Secondary | ICD-10-CM | POA: Diagnosis not present

## 2017-12-01 DIAGNOSIS — Z1389 Encounter for screening for other disorder: Secondary | ICD-10-CM | POA: Diagnosis not present

## 2017-12-01 DIAGNOSIS — J209 Acute bronchitis, unspecified: Secondary | ICD-10-CM | POA: Diagnosis not present

## 2018-01-05 DIAGNOSIS — M7711 Lateral epicondylitis, right elbow: Secondary | ICD-10-CM | POA: Diagnosis not present

## 2018-01-05 DIAGNOSIS — Z6822 Body mass index (BMI) 22.0-22.9, adult: Secondary | ICD-10-CM | POA: Diagnosis not present

## 2018-01-18 DIAGNOSIS — Z6822 Body mass index (BMI) 22.0-22.9, adult: Secondary | ICD-10-CM | POA: Diagnosis not present

## 2018-01-18 DIAGNOSIS — S90511A Abrasion, right ankle, initial encounter: Secondary | ICD-10-CM | POA: Diagnosis not present

## 2018-02-25 DIAGNOSIS — M19011 Primary osteoarthritis, right shoulder: Secondary | ICD-10-CM | POA: Diagnosis not present

## 2018-05-04 DIAGNOSIS — I10 Essential (primary) hypertension: Secondary | ICD-10-CM | POA: Diagnosis not present

## 2018-05-04 DIAGNOSIS — E1129 Type 2 diabetes mellitus with other diabetic kidney complication: Secondary | ICD-10-CM | POA: Diagnosis not present

## 2018-05-04 DIAGNOSIS — N183 Chronic kidney disease, stage 3 (moderate): Secondary | ICD-10-CM | POA: Diagnosis not present

## 2018-05-04 DIAGNOSIS — M1990 Unspecified osteoarthritis, unspecified site: Secondary | ICD-10-CM | POA: Diagnosis not present

## 2018-05-04 DIAGNOSIS — F909 Attention-deficit hyperactivity disorder, unspecified type: Secondary | ICD-10-CM | POA: Diagnosis not present

## 2018-05-04 DIAGNOSIS — Z6822 Body mass index (BMI) 22.0-22.9, adult: Secondary | ICD-10-CM | POA: Diagnosis not present

## 2018-09-02 DIAGNOSIS — I10 Essential (primary) hypertension: Secondary | ICD-10-CM | POA: Diagnosis not present

## 2018-09-02 DIAGNOSIS — J209 Acute bronchitis, unspecified: Secondary | ICD-10-CM | POA: Diagnosis not present

## 2018-09-02 DIAGNOSIS — R05 Cough: Secondary | ICD-10-CM | POA: Diagnosis not present

## 2018-09-02 DIAGNOSIS — Z6822 Body mass index (BMI) 22.0-22.9, adult: Secondary | ICD-10-CM | POA: Diagnosis not present

## 2018-09-02 DIAGNOSIS — J309 Allergic rhinitis, unspecified: Secondary | ICD-10-CM | POA: Diagnosis not present

## 2018-09-02 DIAGNOSIS — E1129 Type 2 diabetes mellitus with other diabetic kidney complication: Secondary | ICD-10-CM | POA: Diagnosis not present

## 2018-09-24 DIAGNOSIS — J45909 Unspecified asthma, uncomplicated: Secondary | ICD-10-CM | POA: Insufficient documentation

## 2018-09-24 DIAGNOSIS — J45998 Other asthma: Secondary | ICD-10-CM | POA: Diagnosis not present

## 2018-09-24 DIAGNOSIS — E1129 Type 2 diabetes mellitus with other diabetic kidney complication: Secondary | ICD-10-CM | POA: Diagnosis not present

## 2018-09-24 DIAGNOSIS — R05 Cough: Secondary | ICD-10-CM | POA: Diagnosis not present

## 2018-11-30 DIAGNOSIS — Z125 Encounter for screening for malignant neoplasm of prostate: Secondary | ICD-10-CM | POA: Diagnosis not present

## 2018-11-30 DIAGNOSIS — E7849 Other hyperlipidemia: Secondary | ICD-10-CM | POA: Diagnosis not present

## 2018-11-30 DIAGNOSIS — R82998 Other abnormal findings in urine: Secondary | ICD-10-CM | POA: Diagnosis not present

## 2018-11-30 DIAGNOSIS — E1129 Type 2 diabetes mellitus with other diabetic kidney complication: Secondary | ICD-10-CM | POA: Diagnosis not present

## 2018-11-30 DIAGNOSIS — I10 Essential (primary) hypertension: Secondary | ICD-10-CM | POA: Diagnosis not present

## 2018-12-07 DIAGNOSIS — J3089 Other allergic rhinitis: Secondary | ICD-10-CM | POA: Diagnosis not present

## 2018-12-07 DIAGNOSIS — M1990 Unspecified osteoarthritis, unspecified site: Secondary | ICD-10-CM | POA: Diagnosis not present

## 2018-12-07 DIAGNOSIS — N183 Chronic kidney disease, stage 3 (moderate): Secondary | ICD-10-CM | POA: Diagnosis not present

## 2018-12-07 DIAGNOSIS — I10 Essential (primary) hypertension: Secondary | ICD-10-CM | POA: Diagnosis not present

## 2018-12-07 DIAGNOSIS — N400 Enlarged prostate without lower urinary tract symptoms: Secondary | ICD-10-CM | POA: Diagnosis not present

## 2018-12-07 DIAGNOSIS — E7849 Other hyperlipidemia: Secondary | ICD-10-CM | POA: Diagnosis not present

## 2018-12-07 DIAGNOSIS — E1129 Type 2 diabetes mellitus with other diabetic kidney complication: Secondary | ICD-10-CM | POA: Diagnosis not present

## 2018-12-07 DIAGNOSIS — Z1331 Encounter for screening for depression: Secondary | ICD-10-CM | POA: Diagnosis not present

## 2018-12-07 DIAGNOSIS — Z1212 Encounter for screening for malignant neoplasm of rectum: Secondary | ICD-10-CM | POA: Diagnosis not present

## 2018-12-07 DIAGNOSIS — Z6820 Body mass index (BMI) 20.0-20.9, adult: Secondary | ICD-10-CM | POA: Diagnosis not present

## 2018-12-07 DIAGNOSIS — Z0000 Encounter for general adult medical examination without abnormal findings: Secondary | ICD-10-CM | POA: Diagnosis not present

## 2018-12-07 DIAGNOSIS — F909 Attention-deficit hyperactivity disorder, unspecified type: Secondary | ICD-10-CM | POA: Diagnosis not present

## 2018-12-07 DIAGNOSIS — K219 Gastro-esophageal reflux disease without esophagitis: Secondary | ICD-10-CM | POA: Diagnosis not present

## 2019-03-31 DIAGNOSIS — S30861A Insect bite (nonvenomous) of abdominal wall, initial encounter: Secondary | ICD-10-CM | POA: Diagnosis not present

## 2019-03-31 DIAGNOSIS — W57XXXA Bitten or stung by nonvenomous insect and other nonvenomous arthropods, initial encounter: Secondary | ICD-10-CM | POA: Diagnosis not present

## 2019-04-04 DIAGNOSIS — Z20828 Contact with and (suspected) exposure to other viral communicable diseases: Secondary | ICD-10-CM | POA: Diagnosis not present

## 2019-04-22 ENCOUNTER — Other Ambulatory Visit: Payer: Self-pay

## 2019-04-22 NOTE — Patient Outreach (Signed)
  [place] [facility]) Care Management Chronic Special Needs Program  04/22/2019  Name: BRACE WELTE DOB: 27-Dec-1945  MRN: [id number]  Mr. Mykai Wendorf is enrolled in a Chronic Special Needs Plan. RNCM called to review health risk assessment and complete individualized care plan. No answer. HIPPA compliant message left on both mobile and home number.  Plan: Chronic care management coordinator will attempt outreach next week.   Thea Silversmith, RN, MSN, [place] [phone]

## 2019-04-26 ENCOUNTER — Other Ambulatory Visit: Payer: Self-pay

## 2019-04-26 NOTE — Patient Outreach (Signed)
  [place] [facility]) Care Management Chronic Special Needs Program    04/26/2019  Name: Allen Hartman, DOB: 1945/12/07  MRN: [id number]   [name] is enrolled in a chronic special needs plan. RNCM called to review health risk assessment and complete individualized care plan. No answer. HIPAA compliant message left on both mobile and home line.  Plan: RNCM will outreach within the month, if no return call.  Thea Silversmith, RN, MSN, [place] [phone]

## 2019-05-18 ENCOUNTER — Other Ambulatory Visit: Payer: Self-pay

## 2019-05-18 NOTE — Patient Outreach (Signed)
  [place] [facility]) Care Management Chronic Special Needs Program  05/18/2019  Name: Allen Hartman DOB: [birth year]  MRN: [id number]  Mr. Stevin Bielinski is enrolled in a chronic special needs plan for Diabetes. Client has not responded to three outreach attempts.  The client's individualized care plan was developed based upon the completed health risk assessment.  Plan:   . Send unsuccessful outreach letter with a copy of individualized care plan to client . Send individualized care plan to provider . Send educational material  Chronic care management coordinator will attempt outreach in 2-4 months.Thea Silversmith, RN, MSN, [place] [phone]

## 2019-06-06 DIAGNOSIS — H6123 Impacted cerumen, bilateral: Secondary | ICD-10-CM | POA: Diagnosis not present

## 2019-06-27 DIAGNOSIS — N183 Chronic kidney disease, stage 3 (moderate): Secondary | ICD-10-CM | POA: Diagnosis not present

## 2019-06-27 DIAGNOSIS — F909 Attention-deficit hyperactivity disorder, unspecified type: Secondary | ICD-10-CM | POA: Diagnosis not present

## 2019-06-27 DIAGNOSIS — N400 Enlarged prostate without lower urinary tract symptoms: Secondary | ICD-10-CM | POA: Diagnosis not present

## 2019-06-27 DIAGNOSIS — E785 Hyperlipidemia, unspecified: Secondary | ICD-10-CM | POA: Diagnosis not present

## 2019-06-27 DIAGNOSIS — I129 Hypertensive chronic kidney disease with stage 1 through stage 4 chronic kidney disease, or unspecified chronic kidney disease: Secondary | ICD-10-CM | POA: Diagnosis not present

## 2019-06-27 DIAGNOSIS — E1129 Type 2 diabetes mellitus with other diabetic kidney complication: Secondary | ICD-10-CM | POA: Diagnosis not present

## 2019-06-27 DIAGNOSIS — F430 Acute stress reaction: Secondary | ICD-10-CM | POA: Diagnosis not present

## 2019-06-30 DIAGNOSIS — E1129 Type 2 diabetes mellitus with other diabetic kidney complication: Secondary | ICD-10-CM | POA: Diagnosis not present

## 2019-06-30 DIAGNOSIS — Z23 Encounter for immunization: Secondary | ICD-10-CM | POA: Diagnosis not present

## 2019-07-25 DIAGNOSIS — E119 Type 2 diabetes mellitus without complications: Secondary | ICD-10-CM | POA: Diagnosis not present

## 2019-07-25 DIAGNOSIS — M65342 Trigger finger, left ring finger: Secondary | ICD-10-CM | POA: Diagnosis not present

## 2019-08-01 DIAGNOSIS — H6123 Impacted cerumen, bilateral: Secondary | ICD-10-CM | POA: Diagnosis not present

## 2019-08-10 ENCOUNTER — Ambulatory Visit: Payer: Self-pay

## 2019-09-19 ENCOUNTER — Other Ambulatory Visit: Payer: Self-pay

## 2019-09-19 NOTE — Patient Outreach (Signed)
  [place] [facility]) Care Management Chronic Special Needs Program  09/19/2019  Name: Allen Hartman DOB: 09-18-1946  MRN: [id number]  Allen Hartman is enrolled in a Chronic Special Needs Plan. RNCM called to follow up and review individualized care plan. No answer. HIPPA compliant message left.   Plan: Chronic care management coordinator will attempt outreach within 2-3 weeks.   Thea Silversmith, RN, MSN, [place] [phone]

## 2019-09-22 ENCOUNTER — Other Ambulatory Visit: Payer: Self-pay

## 2019-09-22 NOTE — Patient Outreach (Signed)
  [place] [facility]) Care Management Chronic Special Needs Program  09/22/2019  Name: Allen Hartman DOB: 1946-06-18  MRN: [id number]  [name] is enrolled in a Chronic Special Needs Plan. RNCM called to follow up and review individualized care plan. No answer. HIPPA compliant message left. 2nd outreach attempt.  Plan: Chronic care management coordinator will attempt outreach within 2-3 weeks.  Allen Silversmith, RN, MSN, [place] [phone]

## 2019-09-28 ENCOUNTER — Other Ambulatory Visit: Payer: Self-pay

## 2019-09-28 NOTE — Patient Outreach (Signed)
  [place] [facility]) Care Management Chronic Special Needs Program  09/28/2019  Name: Allen Hartman DOB: 06-Jun-1946  MRN: [id number]  Mr. [name] is enrolled in a chronic special needs plan for Diabetes. Reviewed and updated care plan. RNCM called to follow up and review individualized care plan. No answer. HIPAA compliant message left on both home and mobile line. 3rd outreach attempt. Per policy/procedure, RNCM will update care plan based on available data.    Goals Addressed            This Visit's Progress   . Client understands the importance of follow-up with providers by attending scheduled visits   On track   . COMPLETED: HEMOGLOBIN A1C < 7      . COMPLETED: Obtain Hemoglobin A1C at least 2 times per year       Done 11/30/2018 and  06/30/2019    . COMPLETED: Visit Primary Care Provider or Endocrinologist at least 2 times per year        Has seen primary care at least two times this year.       Plan: RNCM will send updated care plan to client; send updated care plan to primary care. RNCM will outreach per tier level within the next 9-12 months.   Thea Silversmith, RN, MSN, [place] [phone]   .

## 2019-10-25 ENCOUNTER — Ambulatory Visit: Payer: HMO | Attending: Internal Medicine

## 2020-01-09 ENCOUNTER — Ambulatory Visit: Payer: Self-pay

## 2020-01-10 DIAGNOSIS — Z125 Encounter for screening for malignant neoplasm of prostate: Secondary | ICD-10-CM | POA: Diagnosis not present

## 2020-01-10 DIAGNOSIS — E1129 Type 2 diabetes mellitus with other diabetic kidney complication: Secondary | ICD-10-CM | POA: Diagnosis not present

## 2020-01-10 DIAGNOSIS — E7849 Other hyperlipidemia: Secondary | ICD-10-CM | POA: Diagnosis not present

## 2020-01-17 DIAGNOSIS — E785 Hyperlipidemia, unspecified: Secondary | ICD-10-CM | POA: Diagnosis not present

## 2020-01-17 DIAGNOSIS — E1129 Type 2 diabetes mellitus with other diabetic kidney complication: Secondary | ICD-10-CM | POA: Diagnosis not present

## 2020-01-17 DIAGNOSIS — Z0000 Encounter for general adult medical examination without abnormal findings: Secondary | ICD-10-CM | POA: Diagnosis not present

## 2020-01-17 DIAGNOSIS — I493 Ventricular premature depolarization: Secondary | ICD-10-CM | POA: Diagnosis not present

## 2020-01-17 DIAGNOSIS — K219 Gastro-esophageal reflux disease without esophagitis: Secondary | ICD-10-CM | POA: Diagnosis not present

## 2020-01-17 DIAGNOSIS — N1831 Chronic kidney disease, stage 3a: Secondary | ICD-10-CM | POA: Diagnosis not present

## 2020-01-17 DIAGNOSIS — I129 Hypertensive chronic kidney disease with stage 1 through stage 4 chronic kidney disease, or unspecified chronic kidney disease: Secondary | ICD-10-CM | POA: Diagnosis not present

## 2020-01-17 DIAGNOSIS — Z1331 Encounter for screening for depression: Secondary | ICD-10-CM | POA: Diagnosis not present

## 2020-01-17 DIAGNOSIS — F909 Attention-deficit hyperactivity disorder, unspecified type: Secondary | ICD-10-CM | POA: Diagnosis not present

## 2020-01-17 DIAGNOSIS — M1990 Unspecified osteoarthritis, unspecified site: Secondary | ICD-10-CM | POA: Diagnosis not present

## 2020-01-17 DIAGNOSIS — J309 Allergic rhinitis, unspecified: Secondary | ICD-10-CM | POA: Diagnosis not present

## 2020-01-17 DIAGNOSIS — N400 Enlarged prostate without lower urinary tract symptoms: Secondary | ICD-10-CM | POA: Diagnosis not present

## 2020-01-17 DIAGNOSIS — Z1212 Encounter for screening for malignant neoplasm of rectum: Secondary | ICD-10-CM | POA: Diagnosis not present

## 2020-02-15 DIAGNOSIS — M65342 Trigger finger, left ring finger: Secondary | ICD-10-CM | POA: Diagnosis not present

## 2020-02-28 DIAGNOSIS — I129 Hypertensive chronic kidney disease with stage 1 through stage 4 chronic kidney disease, or unspecified chronic kidney disease: Secondary | ICD-10-CM | POA: Diagnosis not present

## 2020-02-28 DIAGNOSIS — N1831 Chronic kidney disease, stage 3a: Secondary | ICD-10-CM | POA: Diagnosis not present

## 2020-06-11 ENCOUNTER — Other Ambulatory Visit: Payer: Self-pay

## 2020-06-11 NOTE — Patient Outreach (Signed)
  [place] [facility]) Care Management Chronic Special Needs Program    06/11/2020  Name: Allen Marti., DOB: 03/12/1946  MRN: [id number]   [name] is enrolled in a chronic special needs plan for Diabetes. RNCM called to follow up, assess and update individualized care plan. No answer. HIPAA compliant message left.   Plan: Chronic care management coordinator will attempt outreach within 1-2 weeks, if no return call.  Thea Silversmith, RN, MSN, [place] [phone]

## 2020-06-13 ENCOUNTER — Other Ambulatory Visit: Payer: Self-pay

## 2020-06-13 NOTE — Patient Outreach (Signed)
°  [place] [facility]) Care Management Chronic Special Needs Program    06/13/2020  Name: Allen Brosnahan., DOB: Apr 10, 1946  MRN: [id number]   [name] is enrolled in a chronic special needs plan for Diabetes. RNCM called to follow up, assess and review individualized care plan. No answer. HIPAA compliant message left. 2nd outreach attempt.  Plan: Chronic care management coordinator will attempt outreach within 1-2 weeks.  Thea Silversmith, RN, MSN, [place] [phone]

## 2020-06-20 ENCOUNTER — Other Ambulatory Visit: Payer: Self-pay

## 2020-06-20 NOTE — Patient Outreach (Addendum)
  [place] [facility]) Care Management Chronic Special Needs Program  06/20/2020  Name: Allen Hartman. DOB: 12-07-1945  MRN: [id number]  [name] is enrolled in a chronic special needs plan for Diabetes. RNCM called to follow up, assess and update care plan. No answer. HIPAA compliant  Message left. 3rd outreach attempt. RNCM will update care plan based upon available data.   Goals Addressed            This Visit's Progress   . Client understands the importance of follow-up with providers by attending scheduled visits   On track    It is important to follow up with providers as scheduled for recommended labs, procedures and prescription refills.     . Client will verbalize knowledge of self management of Hypertension as evidences by BP reading of 140/90 or less; or as defined by provider   On track    Attend provider visits as scheduled Plan to eat low salt and heart healthy meals with fruits, vegetables, whole grains, lean protein and limit fat and sugars. Check Blood Pressure routinely. If you do not have a blood pressure monitor, please see your over-the-counter benefit catalog. Emmi education provided on "dash diet". Review and call if you have any questions. Exercise per provider recommendations.    . COMPLETED: HEMOGLOBIN A1C < 7       Diabetes self management actions:  Glucose monitoring per provider recommendations  Eat Healthy Plan to eat low carbohydrate, low salt meals, watch portion sizes and avoid sugar sweetened drinks.  Visit provider every 3-6 months as directed  Hbg A1C level every 3-6 months.  Take medications as prescribed.  Exercise per provider recommendations.    . COMPLETED: Maintain timely refills of diabetic medication as prescribed within the year .       Per records timely refills noted. It is important to follow up with your providers as scheduled or recommended prescription refills. Please call your provider and/or your RN  care coordinator if you have any difficulty obtaining medications.    . Obtain Annual Eye (retinal)  Exam    On track    Diabetes can affect your vision. Plan to have a dilated eye exam every year.    . Obtain Annual Foot Exam   On track    Diabetes can affect the nerves in your feet, causing decreased feeling or numbness. Emmi education: "Foot care for diabetics". Please review and call if you have any questions.      Plan: send updated care plan to client, send updated care plan to primary care provider. RNCM will outreach per tier level within the next 9-12 months or sooner as indicated.    Thea Silversmith, RN, MSN, [place] [phone]

## 2020-07-24 DIAGNOSIS — M1990 Unspecified osteoarthritis, unspecified site: Secondary | ICD-10-CM | POA: Diagnosis not present

## 2020-07-24 DIAGNOSIS — F909 Attention-deficit hyperactivity disorder, unspecified type: Secondary | ICD-10-CM | POA: Diagnosis not present

## 2020-07-24 DIAGNOSIS — L989 Disorder of the skin and subcutaneous tissue, unspecified: Secondary | ICD-10-CM | POA: Diagnosis not present

## 2020-07-24 DIAGNOSIS — Z23 Encounter for immunization: Secondary | ICD-10-CM | POA: Diagnosis not present

## 2020-07-24 DIAGNOSIS — N1831 Chronic kidney disease, stage 3a: Secondary | ICD-10-CM | POA: Diagnosis not present

## 2020-07-24 DIAGNOSIS — I129 Hypertensive chronic kidney disease with stage 1 through stage 4 chronic kidney disease, or unspecified chronic kidney disease: Secondary | ICD-10-CM | POA: Diagnosis not present

## 2020-07-24 DIAGNOSIS — J45909 Unspecified asthma, uncomplicated: Secondary | ICD-10-CM | POA: Diagnosis not present

## 2020-07-24 DIAGNOSIS — E1129 Type 2 diabetes mellitus with other diabetic kidney complication: Secondary | ICD-10-CM | POA: Diagnosis not present

## 2020-07-24 DIAGNOSIS — I8390 Asymptomatic varicose veins of unspecified lower extremity: Secondary | ICD-10-CM | POA: Diagnosis not present

## 2020-07-24 DIAGNOSIS — J309 Allergic rhinitis, unspecified: Secondary | ICD-10-CM | POA: Diagnosis not present

## 2020-07-24 DIAGNOSIS — I493 Ventricular premature depolarization: Secondary | ICD-10-CM | POA: Diagnosis not present

## 2020-07-24 DIAGNOSIS — E785 Hyperlipidemia, unspecified: Secondary | ICD-10-CM | POA: Diagnosis not present

## 2020-08-22 ENCOUNTER — Other Ambulatory Visit: Payer: Self-pay

## 2020-08-22 NOTE — Patient Outreach (Signed)
  [place] [facility]) Care Management Chronic Special Needs Program    08/22/2020  Name: Allen Hartman., DOB: Mar 08, 1946  MRN: [id number]   Mr. [name] is enrolled in a chronic special needs plan for Diabetes. [place] Management will continue to provide services for this member through 10/12/2020. The [facility] Care Management Team will assume care 10/13/2020.  Thea Silversmith, RN, MSN, [place] [phone]

## 2020-10-17 ENCOUNTER — Other Ambulatory Visit: Payer: Self-pay

## 2020-11-06 DIAGNOSIS — L570 Actinic keratosis: Secondary | ICD-10-CM | POA: Diagnosis not present

## 2020-11-06 DIAGNOSIS — L814 Other melanin hyperpigmentation: Secondary | ICD-10-CM | POA: Diagnosis not present

## 2020-11-06 DIAGNOSIS — L821 Other seborrheic keratosis: Secondary | ICD-10-CM | POA: Diagnosis not present

## 2020-11-06 DIAGNOSIS — Z85828 Personal history of other malignant neoplasm of skin: Secondary | ICD-10-CM | POA: Diagnosis not present

## 2021-01-16 DIAGNOSIS — K219 Gastro-esophageal reflux disease without esophagitis: Secondary | ICD-10-CM | POA: Diagnosis not present

## 2021-01-16 DIAGNOSIS — R110 Nausea: Secondary | ICD-10-CM | POA: Diagnosis not present

## 2021-01-16 DIAGNOSIS — M533 Sacrococcygeal disorders, not elsewhere classified: Secondary | ICD-10-CM | POA: Diagnosis not present

## 2021-01-16 DIAGNOSIS — M5418 Radiculopathy, sacral and sacrococcygeal region: Secondary | ICD-10-CM | POA: Diagnosis not present

## 2021-01-16 DIAGNOSIS — M79605 Pain in left leg: Secondary | ICD-10-CM | POA: Diagnosis not present

## 2021-01-31 ENCOUNTER — Other Ambulatory Visit: Payer: Self-pay

## 2021-01-31 ENCOUNTER — Encounter: Payer: Self-pay | Admitting: Family Medicine

## 2021-01-31 ENCOUNTER — Ambulatory Visit (INDEPENDENT_AMBULATORY_CARE_PROVIDER_SITE_OTHER): Payer: HMO | Admitting: Family Medicine

## 2021-01-31 DIAGNOSIS — M5442 Lumbago with sciatica, left side: Secondary | ICD-10-CM | POA: Diagnosis not present

## 2021-01-31 MED ORDER — BACLOFEN 10 MG PO TABS: 5.0000 mg | ORAL_TABLET | Freq: Three times a day (TID) | ORAL | 3 refills | Status: DC | PRN

## 2021-01-31 NOTE — Progress Notes (Signed)
   Office Visit Note   Patient: Allen Hartman.           Date of Birth: [birth year]           MRN: [id number] Visit Date: 01/31/2021 Requested by: Prince Solian, MD [address] [place],  [place] [address] PCP: Prince Solian, MD  Subjective: Chief Complaint  Patient presents with  . Lower Back - Pain    Pain in the left lower back and radiating down the posterior leg to the foot. Started 01/21/21. Just started oral prednisone, prescribed by PCP.    HPI: He is here at the request of Kym Groom for low back and left posterior hip and leg pain.  Symptoms started April 11.  He was doing a workout and felt a twinge of pain in the posterior hip.  The pain has gotten progressively worse since then.  He has been doing physical therapy with minimal relief of symptoms.  He was given Skelaxin by his PCP, he had x-rays obtained which were negative for acute abnormality per patient report, and then most recently he was given prednisone and tramadol.  He has a history of lumbar discectomy about 11 years ago and he was pain-free after that until now.  In the past year or so, he has lost a lot of weight unintentionally.  Denies any fevers or chills, denies any night sweats.               ROS:   All other systems were reviewed and are negative.  Objective: Vital Signs: There were no vitals taken for this visit.  Physical Exam:  General:  Alert and oriented, in no acute distress. Pulm:  Breathing unlabored. Psy:  Normal mood, congruent affect. Skin: No rash Low back: No significant tenderness over the lumbar spinous processes.  He has tenderness over the mid sacrum and in the sciatic notch on the left.  Piriformis stretch is negative today, no pain with internal hip rotation.  Straight leg raise is equivocal, lower extremity strength and reflexes are normal.  Imaging: No results found.  Assessment & Plan: 1.  Left posterior hip and leg pain, suspicious for lumbar disc protrusion.   Unintended weight loss, concerning for neoplasm. -Due to the severity of his pain, we will order a stat MRI scan followed by epidural injection if indicated.  Baclofen given to take as needed for muscle spasm.     Procedures: No procedures performed        PMFS History: There are no problems to display for this patient.  Past Medical History:  Diagnosis Date  . Hypertension     History reviewed. No pertinent family history.  History reviewed. No pertinent surgical history. Social History   Occupational History  . Not on file  Tobacco Use  . Smoking status: Not on file  . Smokeless tobacco: Not on file  Substance and Sexual Activity  . Alcohol use: Not on file  . Drug use: Not on file  . Sexual activity: Not on file

## 2021-02-01 ENCOUNTER — Ambulatory Visit
Admission: RE | Admit: 2021-02-01 | Discharge: 2021-02-01 | Disposition: A | Discharge status: Home / Self Care | Payer: HMO | Source: Ambulatory Visit | Attending: Family Medicine | Admitting: Family Medicine

## 2021-02-01 DIAGNOSIS — M48061 Spinal stenosis, lumbar region without neurogenic claudication: Secondary | ICD-10-CM | POA: Diagnosis not present

## 2021-02-01 DIAGNOSIS — M5442 Lumbago with sciatica, left side: Secondary | ICD-10-CM

## 2021-02-03 ENCOUNTER — Telehealth: Payer: Self-pay | Admitting: Family Medicine

## 2021-02-03 NOTE — Telephone Encounter (Signed)
MRI shows:  There's a 13 mm lesion in the L4 vertebra.  It is unclear what this represents.  I will check with the radiologist for clarification.  There are a couple disc bulges, but no nerve compression.  No clear-cut indication for surgery.  Could proceed with epidural injection for pain relief if he'd like.

## 2021-02-04 ENCOUNTER — Ambulatory Visit: Payer: HMO | Admitting: Family Medicine

## 2021-02-04 ENCOUNTER — Telehealth: Payer: Self-pay | Admitting: Family Medicine

## 2021-02-04 DIAGNOSIS — M545 Low back pain, unspecified: Secondary | ICD-10-CM

## 2021-02-04 NOTE — Telephone Encounter (Signed)
I spoke to radiology about his lumbar MRI scan.  The lesion at L4 is nonspecific, but it was not present in 2011.  Additional work-up is recommended including three-phase bone scan and oncology referral.  I spoke to patient about this and he is in agreement with this plan.  He continues to have significant pain.  There were some bulging disks on the MRI but no nerve compression.  However, for pain relief, he would like to proceed with epidural steroid injection and I think that would be reasonable.

## 2021-02-04 NOTE — Telephone Encounter (Signed)
Dr. Junius Roads called the patient with the results this morning - please see his note.

## 2021-02-05 ENCOUNTER — Telehealth: Payer: Self-pay | Admitting: Physical Medicine and Rehabilitation

## 2021-02-05 NOTE — Telephone Encounter (Signed)
Please Advise

## 2021-02-05 NOTE — Telephone Encounter (Signed)
Called pt and r/s 4/28

## 2021-02-05 NOTE — Telephone Encounter (Signed)
Pt wife called and states her husband is in a lot of pain and needs to be seen sooner than may 10 th.She said if he can't get in any sooner se is afraid he will have to go to the ER. I let her know that was probably the first available. Is there any way you can work him in sooner or put him on a wait list or something. CB [phone] 

## 2021-02-05 NOTE — Telephone Encounter (Signed)
Pt wife called and states her husband is in a lot of pain and needs to be seen sooner than may 10 th.She said if he can't get in any sooner se is afraid he will have to go to the ER. I let her know that was probably the first available. Is there any way you can work him in sooner or put him on a wait list or something. CB [phone]

## 2021-02-06 ENCOUNTER — Telehealth: Payer: Self-pay | Admitting: Hematology and Oncology

## 2021-02-06 NOTE — Telephone Encounter (Signed)
Received an urgent new pt referral from Dr. Junius Roads for bone lesion on vertebrae and wt loss. [name] has been cld and scheduled to see Dr. Chryl Heck on 5/2 at 10:40am. Pt aware to arrive 20 minutes early.

## 2021-02-07 ENCOUNTER — Encounter: Payer: Self-pay | Admitting: Physical Medicine and Rehabilitation

## 2021-02-07 ENCOUNTER — Ambulatory Visit: Payer: Self-pay

## 2021-02-07 ENCOUNTER — Other Ambulatory Visit: Payer: Self-pay

## 2021-02-07 ENCOUNTER — Ambulatory Visit (INDEPENDENT_AMBULATORY_CARE_PROVIDER_SITE_OTHER): Payer: HMO | Admitting: Physical Medicine and Rehabilitation

## 2021-02-07 VITALS — BP 140/77 | HR 78

## 2021-02-07 DIAGNOSIS — M5416 Radiculopathy, lumbar region: Secondary | ICD-10-CM

## 2021-02-07 MED ORDER — METHYLPREDNISOLONE ACETATE 80 MG/ML IJ SUSP
80.0000 mg | Freq: Once | INTRAMUSCULAR | Status: AC
  Administered 2021-02-07: 80 mg

## 2021-02-07 NOTE — Patient Instructions (Signed)

## 2021-02-07 NOTE — Progress Notes (Signed)
Pt state lower back pain that travels down his left leg to the foot. Pt state sitting makes the pain worse. Pt state he takes pain meds to help ease his pain.  Numeric Pain Rating Scale and Functional Assessment Average Pain 8   In the last MONTH (on 0-10 scale) has pain interfered with the following?  1. General activity like being  able to carry out your everyday physical activities such as walking, climbing stairs, carrying groceries, or moving a chair?  Rating(7)   +Driver, -BT, -Dye Allergies.

## 2021-02-11 ENCOUNTER — Other Ambulatory Visit: Payer: Self-pay

## 2021-02-11 ENCOUNTER — Inpatient Hospital Stay: Payer: HMO | Attending: Hematology and Oncology | Admitting: Hematology and Oncology

## 2021-02-11 ENCOUNTER — Inpatient Hospital Stay: Payer: HMO

## 2021-02-11 ENCOUNTER — Encounter: Payer: Self-pay | Admitting: Hematology and Oncology

## 2021-02-11 VITALS — BP 126/70 | HR 86 | Temp 96.4°F | Resp 18 | Wt 133.5 lb

## 2021-02-11 DIAGNOSIS — Z8041 Family history of malignant neoplasm of ovary: Secondary | ICD-10-CM | POA: Insufficient documentation

## 2021-02-11 DIAGNOSIS — M5440 Lumbago with sciatica, unspecified side: Secondary | ICD-10-CM

## 2021-02-11 DIAGNOSIS — Z79899 Other long term (current) drug therapy: Secondary | ICD-10-CM | POA: Insufficient documentation

## 2021-02-11 DIAGNOSIS — M5416 Radiculopathy, lumbar region: Secondary | ICD-10-CM | POA: Diagnosis not present

## 2021-02-11 DIAGNOSIS — M899 Disorder of bone, unspecified: Secondary | ICD-10-CM | POA: Diagnosis not present

## 2021-02-11 DIAGNOSIS — R634 Abnormal weight loss: Secondary | ICD-10-CM | POA: Diagnosis not present

## 2021-02-11 DIAGNOSIS — Z125 Encounter for screening for malignant neoplasm of prostate: Secondary | ICD-10-CM | POA: Diagnosis not present

## 2021-02-11 DIAGNOSIS — E785 Hyperlipidemia, unspecified: Secondary | ICD-10-CM | POA: Diagnosis not present

## 2021-02-11 DIAGNOSIS — E1129 Type 2 diabetes mellitus with other diabetic kidney complication: Secondary | ICD-10-CM | POA: Diagnosis not present

## 2021-02-11 LAB — CMP (CANCER CENTER ONLY)
ALT: 10 U/L (ref 0–44)
AST: 15 U/L (ref 15–41)
Albumin: 4.2 g/dL (ref 3.5–5.0)
Alkaline Phosphatase: 70 U/L (ref 38–126)
Anion gap: 12 (ref 5–15)
BUN: 35 mg/dL — ABNORMAL HIGH (ref 8–23)
CO2: 31 mmol/L (ref 22–32)
Calcium: 10.4 mg/dL — ABNORMAL HIGH (ref 8.9–10.3)
Chloride: 93 mmol/L — ABNORMAL LOW (ref 98–111)
Creatinine: 1.41 mg/dL — ABNORMAL HIGH (ref 0.61–1.24)
GFR, Estimated: 52 mL/min — ABNORMAL LOW (ref >60)
Glucose, Bld: 281 mg/dL — ABNORMAL HIGH (ref 70–99)
Potassium: 4.9 mmol/L (ref 3.5–5.1)
Sodium: 136 mmol/L (ref 135–145)
Total Bilirubin: 0.4 mg/dL (ref 0.3–1.2)
Total Protein: 7.3 g/dL (ref 6.5–8.1)

## 2021-02-11 LAB — CBC WITH DIFFERENTIAL/PLATELET
Abs Immature Granulocytes: 0.04 10*3/uL (ref 0.00–0.07)
Basophils Absolute: 0.1 10*3/uL (ref 0.0–0.1)
Basophils Relative: 1 %
Eosinophils Absolute: 0.1 10*3/uL (ref 0.0–0.5)
Eosinophils Relative: 1 %
HCT: 38.8 % — ABNORMAL LOW (ref 39.0–52.0)
Hemoglobin: 12.6 g/dL — ABNORMAL LOW (ref 13.0–17.0)
Immature Granulocytes: 0 %
Lymphocytes Relative: 21 %
Lymphs Abs: 1.9 10*3/uL (ref 0.7–4.0)
MCH: 27.4 pg (ref 26.0–34.0)
MCHC: 32.5 g/dL (ref 30.0–36.0)
MCV: 84.3 fL (ref 80.0–100.0)
Monocytes Absolute: 0.8 10*3/uL (ref 0.1–1.0)
Monocytes Relative: 9 %
Neutro Abs: 6 10*3/uL (ref 1.7–7.7)
Neutrophils Relative %: 68 %
Platelets: 497 10*3/uL — ABNORMAL HIGH (ref 150–400)
RBC: 4.6 MIL/uL (ref 4.22–5.81)
RDW: 15.1 % (ref 11.5–15.5)
WBC: 8.9 10*3/uL (ref 4.0–10.5)
nRBC: 0 % (ref 0.0–0.2)

## 2021-02-11 NOTE — Procedures (Signed)
Lumbosacral Transforaminal Epidural Steroid Injection - Sub-Pedicular Approach with Fluoroscopic Guidance  Patient: Allen Hartman.      Date of Birth: 1945/11/27 MRN: [id number] PCP: Prince Solian, MD      Visit Date: 02/07/2021   Universal Protocol:    Date/Time: 02/07/2021  Consent Given By: the patient  Position: PRONE  Additional Comments: Vital signs were monitored before and after the procedure. Patient was prepped and draped in the usual sterile fashion. The correct patient, procedure, and site was verified.   Injection Procedure Details:   Procedure diagnoses: Lumbar radiculopathy [M54.16]    Meds Administered:  Meds ordered this encounter  Medications  . methylPREDNISolone acetate (DEPO-MEDROL) injection 80 mg    Laterality: Left  Location/Site:  L5-S1  Needle:5.0 in., 22 ga.  Short bevel or Quincke spinal needle  Needle Placement: Transforaminal  Findings:    -Comments: Excellent flow of contrast along the nerve, nerve root and into the epidural space.  Procedure Details: After squaring off the end-plates to get a true AP view, the C-arm was positioned so that an oblique view of the foramen as noted above was visualized. The target area is just inferior to the "nose of the scotty dog" or sub pedicular. The soft tissues overlying this structure were infiltrated with 2-3 ml. of 1% Lidocaine without Epinephrine.  The spinal needle was inserted toward the target using a "trajectory" view along the fluoroscope beam.  Under AP and lateral visualization, the needle was advanced so it did not puncture dura and was located close the 6 O'Clock position of the pedical in AP tracterory. Biplanar projections were used to confirm position. Aspiration was confirmed to be negative for CSF and/or blood. A 1-2 ml. volume of Isovue-250 was injected and flow of contrast was noted at each level. Radiographs were obtained for documentation purposes.   After attaining the  desired flow of contrast documented above, a 0.5 to 1.0 ml test dose of 0.25% Marcaine was injected into each respective transforaminal space.  The patient was observed for 90 seconds post injection.  After no sensory deficits were reported, and normal lower extremity motor function was noted,   the above injectate was administered so that equal amounts of the injectate were placed at each foramen (level) into the transforaminal epidural space.   Additional Comments:  The patient tolerated the procedure well Dressing: 2 x 2 sterile gauze and Band-Aid    Post-procedure details: Patient was observed during the procedure. Post-procedure instructions were reviewed.  Patient left the clinic in stable condition.

## 2021-02-11 NOTE — Progress Notes (Signed)
[place] CONSULT NOTE  Patient Care Team: Prince Solian, MD as PCP - General (Internal Medicine)  CHIEF COMPLAINTS/PURPOSE OF CONSULTATION:  Suspicion for cancer  ASSESSMENT & PLAN:   No problem-specific Assessment & Plan notes found for this encounter.  No orders of the defined types were placed in this encounter.  This is a very pleasant 75 yr old male patient with PMH significant for HTN referred to hematology for evaluation of acute low back pain, indeterminate lesion in L4 MRI lumbar spine and some weight loss, referred to oncology for further recommendations. Upon detailed history taking, it appears pain has started during a PT work out for some low severity back pain and since then has become intense, He reponded briefly to steroids, tramadol and baclofen help some as well but still has severe pain aggravated by sitting, improves with walking and laying in left lateral position. PE unremarkable, no palpable lymphadenopathy, spinal tenderness, splenomegaly, he was in a lot of pain during the visit. I have reviewed MRI with radiology team, they say its indeterminate and contrast will help if patient is symptomatic, surveillance with repeat MRI if he is asymptomatic. Since he is very symptomatic, I have recommend some labs for myeloma, PSA, CBC and CMP, and re ordered MRI with contrast to evaluate the indeterminate lesion. If there is any suggestion of malignancy, we will do full body imaging. He will RTC in 2 weeks with repeat labs and MRI results.  2. Family history of ovarian cancer.Recommended genetic testing once acute episode of back pain improves  All his questions were answered to the best of my knowledge. Baseline ECOG is excellent.   HISTORY OF PRESENTING ILLNESS:   Allen Hartman. 75 y.o. male is here because of suspicion for cancer and an indeterminate lesion found on most recent MRI without contrast of his lumbar spine.  Allen Hartman is here for  an initial visit with his wife. He is a good historian. Apparently the back pain started on April 4 th. In wife's own words, patient was working with PT for some glitch in his back which was causing him some low back pain. The pain is not any where as intense as its now. He was able to do all his ADL's at that time and was a Chief Executive Officer by occupation, was very busy. Since April 4 th, he has been having intense back pain that radiates to his leg intermittently with shocks. No bladder or bowel incontinence. The weight loss is acute, only since the pain started, he lost about 10 lbs in the past 4 weeks. Prior to this, he had no health issues except for mild low back pain. He had mild prostate enlargement according to wife, used to wake up once a night but since the pain started he has been waking up every hr to pee. Patient denies any incontinence He takes baclofen and tramadol thrice a day. No change in breathing. No other complaints today Never smoked, no regular alcohol consumption Sister died of ovarian cancer, another sister had lymphoma. No B symptoms, other bone pains.  Rest of the pertinent 10 point ROS reviewed and negative  REVIEW OF SYSTEMS:    Constitutional: Denies fevers, chills or abnormal night sweats Eyes: Denies blurriness of vision, double vision or watery eyes Ears, nose, mouth, throat, and face: Denies mucositis or sore throat Respiratory: Denies cough, dyspnea or wheezes Cardiovascular: Denies palpitation, chest discomfort or lower extremity swelling Gastrointestinal:  Denies nausea, heartburn or change in bowel  habits Skin: Denies abnormal skin rashes Lymphatics: Denies new lymphadenopathy or easy bruising Neurological:Denies numbness, tingling or new weaknesses Behavioral/Psych: Mood is stable, no new changes  All other systems were reviewed with the patient and are negative.  MEDICAL HISTORY:  Past Medical History:  Diagnosis Date  . Hypertension     SURGICAL  HISTORY: No past surgical history on file.  SOCIAL HISTORY: Social History   Socioeconomic History  . Marital status: Married    Spouse name: Not on file  . Number of children: Not on file  . Years of education: Not on file  . Highest education level: Not on file  Occupational History  . Not on file  Tobacco Use  . Smoking status: Not on file  . Smokeless tobacco: Not on file  Substance and Sexual Activity  . Alcohol use: Not on file  . Drug use: Not on file  . Sexual activity: Not on file  Other Topics Concern  . Not on file  Social History Narrative  . Not on file   Social Determinants of Health   Financial Resource Strain: Not on file  Food Insecurity: Not on file  Transportation Needs: Not on file  Physical Activity: Not on file  Stress: Not on file  Social Connections: Not on file  Intimate Partner Violence: Not on file    FAMILY HISTORY: No family history on file.  ALLERGIES:  is allergic to sulfa antibiotics.  MEDICATIONS:  Current Outpatient Medications  Medication Sig Dispense Refill  . baclofen (LIORESAL) 10 MG tablet Take 0.5-1 tablets (5-10 mg total) by mouth 3 (three) times daily as needed for muscle spasms. 30 each 3  . cetirizine (ZYRTEC) 10 MG tablet     . CONCERTA 27 MG CR tablet Take 27 mg by mouth every morning.    . Diclofenac Sodium 3 % GEL Apply topically.    . insulin glargine (LANTUS SOLOSTAR) 100 UNIT/ML Solostar Pen INJECT 5-7 UNITS Allen Hartman Q EVENING BEFORE BED    . irbesartan (AVAPRO) 300 MG tablet Take 300 mg by mouth daily.    Marland Kitchen KOMBIGLYZE XR 2.02-999 MG TB24 Take 1 tablet by mouth 2 (two) times daily.    . ondansetron (ZOFRAN-ODT) 8 MG disintegrating tablet Take 8 mg by mouth every 8 (eight) hours as needed.    Glory Rosebush VERIO test strip 3 (three) times daily.    . pantoprazole (PROTONIX) 40 MG tablet Take 1 tablet by mouth 2 (two) times daily.    . tamsulosin (FLOMAX) 0.4 MG CAPS capsule SMARTSIG:1 Capsule(s) By Mouth Every Evening     . traMADol (ULTRAM) 50 MG tablet Take 50 mg by mouth every 8 (eight) hours as needed.     No current facility-administered medications for this visit.     PHYSICAL EXAMINATION:  ECOG PERFORMANCE STATUS: 2 - Symptomatic, <50% confined to bed  Vitals:   02/11/21 1103  BP: 126/70  Pulse: 86  Resp: 18  Temp: (!) 96.4 F (35.8 C)  SpO2: 100%   Filed Weights   02/11/21 1103  Weight: 133 lb 8 oz (60.6 kg)    GENERAL:alert, moderate pain distress. SKIN: skin color, texture, turgor are normal, no rashes or significant lesions. EYES: normal, conjunctiva are pink and non-injected OROPHARYNX: no exudate, no erythema and lips, buccal mucosa, and tongue normal  NECK: supple, thyroid normal size, non-tender, without nodularity. No palpable lymphadenopathy cervical or axillary. LUNGS: clear to auscultation and percussion with normal breathing effort HEART: regular rate & rhythm and no  murmurs and no lower extremity edema ABDOMEN: abdomen soft, non-tender and normal bowel sounds. No palpable hepatosplenomegaly. Musculoskeletal:no cyanosis of digits and no clubbing. No spinal tenderness or paraspinal tenderness, flank tenderness. PSYCH: alert & oriented x 3 with fluent speech NEURO: no focal motor/sensory deficits  LABORATORY DATA:  I have reviewed the data as listed Lab Results  Component Value Date   WBC 9.0 06/20/2010   HGB 13.0 06/20/2010   HCT 37.2 (L) 06/20/2010   MCV 84.5 06/20/2010   PLT 286 06/20/2010     Chemistry      Component Value Date/Time   NA 140 06/20/2010 1003   K 3.6 06/20/2010 1003   CL 104 06/20/2010 1003   CO2 28 06/20/2010 1003   BUN 24 (H) 06/20/2010 1003   CREATININE 0.95 06/20/2010 1003      Component Value Date/Time   CALCIUM 9.3 06/20/2010 1003   ALKPHOS 64 06/20/2010 1003   AST 21 06/20/2010 1003   ALT 19 06/20/2010 1003   BILITOT 0.4 06/20/2010 1003       RADIOGRAPHIC STUDIES: I have personally reviewed the radiological images as  listed and agreed with the findings in the report. Epidural Steroid injection  Result Date: 02/07/2021 Magnus Sinning, MD     02/11/2021  5:28 AM Lumbosacral Transforaminal Epidural Steroid Injection - Sub-Pedicular Approach with Fluoroscopic Guidance Patient: Allen Hartman.     Date of Birth: February 06, 1946 MRN: [id number] PCP: Prince Solian, MD     Visit Date: 02/07/2021  Universal Protocol:   Date/Time: 02/07/2021 Consent Given By: the patient Position: PRONE Additional Comments: Vital signs were monitored before and after the procedure. Patient was prepped and draped in the usual sterile fashion. The correct patient, procedure, and site was verified. Injection Procedure Details: Procedure diagnoses: Lumbar radiculopathy [M54.16]  Meds Administered: Meds ordered this encounter Medications . methylPREDNISolone acetate (DEPO-MEDROL) injection 80 mg Laterality: Left Location/Site: L5-S1 Needle:5.0 in., 22 ga.  Short bevel or Quincke spinal needle Needle Placement: Transforaminal Findings:   -Comments: Excellent flow of contrast along the nerve, nerve root and into the epidural space. Procedure Details: After squaring off the end-plates to get a true AP view, the C-arm was positioned so that an oblique view of the foramen as noted above was visualized. The target area is just inferior to the "nose of the scotty dog" or sub pedicular. The soft tissues overlying this structure were infiltrated with 2-3 ml. of 1% Lidocaine without Epinephrine. The spinal needle was inserted toward the target using a "trajectory" view along the fluoroscope beam.  Under AP and lateral visualization, the needle was advanced so it did not puncture dura and was located close the 6 O'Clock position of the pedical in AP tracterory. Biplanar projections were used to confirm position. Aspiration was confirmed to be negative for CSF and/or blood. A 1-2 ml. volume of Isovue-250 was injected and flow of contrast was noted at each level.  Radiographs were obtained for documentation purposes. After attaining the desired flow of contrast documented above, a 0.5 to 1.0 ml test dose of 0.25% Marcaine was injected into each respective transforaminal space.  The patient was observed for 90 seconds post injection.  After no sensory deficits were reported, and normal lower extremity motor function was noted,   the above injectate was administered so that equal amounts of the injectate were placed at each foramen (level) into the transforaminal epidural space. Additional Comments: The patient tolerated the procedure well Dressing: 2 x 2 sterile gauze and  Band-Aid  Post-procedure details: Patient was observed during the procedure. Post-procedure instructions were reviewed. Patient left the clinic in stable condition.   Allen Lumbar Spine w/o contrast  Result Date: 02/01/2021 CLINICAL DATA:  Lumbar radiculopathy. Previous lumbar surgery. New symptoms. EXAM: MRI LUMBAR SPINE WITHOUT CONTRAST TECHNIQUE: Multiplanar, multisequence Allen imaging of the lumbar spine was performed. No intravenous contrast was administered. COMPARISON:  MRI lumbar spine 06/06/2010 FINDINGS: Segmentation: 5 non rib-bearing lumbar type vertebral bodies are present. The lowest fully formed vertebral body is L5. Alignment: No significant listhesis is present. Straightening of the normal lumbar lordosis is similar the prior exam. Vertebrae: Indeterminate T2 hyperintense lesion is present at L4 measuring up to 13 mm. No other focal lesions are present. Marrow signal and vertebral body heights are otherwise normal. Conus medullaris and cauda equina: Conus extends to the L1-2 level. Conus and cauda equina appear normal. Paraspinal and other soft tissues: Limited imaging the abdomen is unremarkable. There is no significant adenopathy. No solid organ lesions are present. Paraspinous musculature is within normal limits. Disc levels: L1-2: Mild broad-based disc bulging without significant stenosis.  L2-3: Broad-based disc bulge noted. Mild facet hypertrophy is present bilaterally. This leads to mild foraminal narrowing bilaterally. L3-4: A broad-based disc bulge is present. Mild facet hypertrophy is noted bilaterally. Moderate right and mild left foraminal narrowing is present. L4-5: Shallow right paramedian disc protrusion is healed since the prior study. No significant central canal stenosis is present. Moderate bilateral foraminal stenosis has progressed. L5-S1: Left laminectomy is present. Central canal is decompressed. The right foramen is patent. Mild left foraminal narrowing persists. IMPRESSION: 1. Moderate bilateral foraminal stenosis at L4-5 has progressed. This is the most significant level. 2. Left laminectomy at L5-S1 with decompression of the central canal. Mild left foraminal narrowing persists. 3. Moderate right and mild left foraminal stenosis at L3-4 is new. 4. Mild foraminal narrowing bilaterally at L2-3. Electronically Signed   By: San Morelle M.D.   On: 02/01/2021 17:35   XR C-ARM NO REPORT  Result Date: 02/07/2021 Please see Notes tab for imaging impression.   All questions were answered. The patient knows to call the clinic with any problems, questions or concerns. I spent 45 minutes in the care of this patient including H and P, review of records, counseling and coordination of care.     Benay Pike, MD 02/11/2021 11:16 AM

## 2021-02-11 NOTE — Progress Notes (Signed)
Allen Hartman. - 75 y.o. male MRN [id number]  Date of birth: [birth year]  Office Visit Note: Visit Date: 02/07/2021 PCP: Prince Solian, MD Referred by: Prince Solian, MD  Subjective: Chief Complaint  Patient presents with  . Lower Back - Pain  . Left Leg - Pain  . Left Foot - Pain   HPI:  Allen Hartman. is a 75 y.o. male who comes in today at the request of Dr. Eunice Blase for planned Left L5-S1 Lumbar epidural steroid injection with fluoroscopic guidance.  The patient has failed conservative care including home exercise, medications, time and activity modification.  This injection will be diagnostic and hopefully therapeutic.  Please see requesting physician notes for further details and justification. MRI reviewed with images and spine model.  MRI reviewed in the note below.  Patient with classic L5 dermatome pain and dysesthesia.  This is clearly on the left none on the right.  Pain level not clearly supported with MRI findings although he has had prior laminectomy and some foraminal narrowing.  Foraminal narrowing worse at L4-5 but symptoms clearly L5.  He does not get much relief would look at an L4 injection.  He will continue on current medications and follow-up with Dr. Junius Roads.    ROS Otherwise per HPI.  Assessment & Plan: Visit Diagnoses:    ICD-10-CM   1. Lumbar radiculopathy  M54.16 XR C-ARM NO REPORT    Epidural Steroid injection    methylPREDNISolone acetate (DEPO-MEDROL) injection 80 mg    Plan: No additional findings.   Meds & Orders:  Meds ordered this encounter  Medications  . methylPREDNISolone acetate (DEPO-MEDROL) injection 80 mg    Orders Placed This Encounter  Procedures  . XR C-ARM NO REPORT  . Epidural Steroid injection    Follow-up: Return for visit to requesting physician as needed.   Procedures: No procedures performed  Lumbosacral Transforaminal Epidural Steroid Injection - Sub-Pedicular Approach with Fluoroscopic  Guidance  Patient: Allen Hartman.      Date of Birth: Jan 31, 1946 MRN: [id number] PCP: Prince Solian, MD      Visit Date: 02/07/2021   Universal Protocol:    Date/Time: 02/07/2021  Consent Given By: the patient  Position: PRONE  Additional Comments: Vital signs were monitored before and after the procedure. Patient was prepped and draped in the usual sterile fashion. The correct patient, procedure, and site was verified.   Injection Procedure Details:   Procedure diagnoses: Lumbar radiculopathy [M54.16]    Meds Administered:  Meds ordered this encounter  Medications  . methylPREDNISolone acetate (DEPO-MEDROL) injection 80 mg    Laterality: Left  Location/Site:  L5-S1  Needle:5.0 in., 22 ga.  Short bevel or Quincke spinal needle  Needle Placement: Transforaminal  Findings:    -Comments: Excellent flow of contrast along the nerve, nerve root and into the epidural space.  Procedure Details: After squaring off the end-plates to get a true AP view, the C-arm was positioned so that an oblique view of the foramen as noted above was visualized. The target area is just inferior to the "nose of the scotty dog" or sub pedicular. The soft tissues overlying this structure were infiltrated with 2-3 ml. of 1% Lidocaine without Epinephrine.  The spinal needle was inserted toward the target using a "trajectory" view along the fluoroscope beam.  Under AP and lateral visualization, the needle was advanced so it did not puncture dura and was located close the 6 O'Clock position of the pedical in AP  tracterory. Biplanar projections were used to confirm position. Aspiration was confirmed to be negative for CSF and/or blood. A 1-2 ml. volume of Isovue-250 was injected and flow of contrast was noted at each level. Radiographs were obtained for documentation purposes.   After attaining the desired flow of contrast documented above, a 0.5 to 1.0 ml test dose of 0.25% Marcaine was  injected into each respective transforaminal space.  The patient was observed for 90 seconds post injection.  After no sensory deficits were reported, and normal lower extremity motor function was noted,   the above injectate was administered so that equal amounts of the injectate were placed at each foramen (level) into the transforaminal epidural space.   Additional Comments:  The patient tolerated the procedure well Dressing: 2 x 2 sterile gauze and Band-Aid    Post-procedure details: Patient was observed during the procedure. Post-procedure instructions were reviewed.  Patient left the clinic in stable condition.      Clinical History: MRI LUMBAR SPINE WITHOUT CONTRAST  TECHNIQUE: Multiplanar, multisequence MR imaging of the lumbar spine was performed. No intravenous contrast was administered.  COMPARISON:  MRI lumbar spine 06/06/2010  FINDINGS: Segmentation: 5 non rib-bearing lumbar type vertebral bodies are present. The lowest fully formed vertebral body is L5.  Alignment: No significant listhesis is present. Straightening of the normal lumbar lordosis is similar the prior exam.  Vertebrae: Indeterminate T2 hyperintense lesion is present at L4 measuring up to 13 mm. No other focal lesions are present. Marrow signal and vertebral body heights are otherwise normal.  Conus medullaris and cauda equina: Conus extends to the L1-2 level. Conus and cauda equina appear normal.  Paraspinal and other soft tissues: Limited imaging the abdomen is unremarkable. There is no significant adenopathy. No solid organ lesions are present. Paraspinous musculature is within normal limits.  Disc levels:  L1-2: Mild broad-based disc bulging without significant stenosis.  L2-3: Broad-based disc bulge noted. Mild facet hypertrophy is present bilaterally. This leads to mild foraminal narrowing bilaterally.  L3-4: A broad-based disc bulge is present. Mild facet hypertrophy  is noted bilaterally. Moderate right and mild left foraminal narrowing is present.  L4-5: Shallow right paramedian disc protrusion is healed since the prior study. No significant central canal stenosis is present. Moderate bilateral foraminal stenosis has progressed.  L5-S1: Left laminectomy is present. Central canal is decompressed. The right foramen is patent. Mild left foraminal narrowing persists.  IMPRESSION: 1. Moderate bilateral foraminal stenosis at L4-5 has progressed. This is the most significant level. 2. Left laminectomy at L5-S1 with decompression of the central canal. Mild left foraminal narrowing persists. 3. Moderate right and mild left foraminal stenosis at L3-4 is new. 4. Mild foraminal narrowing bilaterally at L2-3.   Electronically Signed   By: San Morelle M.D.   On: 02/01/2021 17:35     Objective:  VS:  HT:    WT:   BMI:     BP:140/77  HR:78bpm  TEMP: ( )  RESP:  Physical Exam Vitals and nursing note reviewed.  Constitutional:      General: He is not in acute distress.    Appearance: Normal appearance. He is not ill-appearing.  HENT:     Head: Normocephalic and atraumatic.     Right Ear: External ear normal.     Left Ear: External ear normal.     Nose: No congestion.  Eyes:     Extraocular Movements: Extraocular movements intact.  Cardiovascular:     Rate and Rhythm: Normal rate.  Pulses: Normal pulses.  Pulmonary:     Effort: Pulmonary effort is normal. No respiratory distress.  Abdominal:     General: There is no distension.     Palpations: Abdomen is soft.  Musculoskeletal:        General: No tenderness or signs of injury.     Cervical back: Neck supple.     Right lower leg: No edema.     Left lower leg: No edema.     Comments: Patient has good distal strength without clonus.  Skin:    Findings: No erythema or rash.  Neurological:     General: No focal deficit present.     Mental Status: He is alert and oriented  to person, place, and time.     Sensory: No sensory deficit.     Motor: No weakness or abnormal muscle tone.     Coordination: Coordination normal.  Psychiatric:        Mood and Affect: Mood normal.        Behavior: Behavior normal.      Imaging: No results found.

## 2021-02-12 ENCOUNTER — Telehealth: Payer: Self-pay | Admitting: Physical Medicine and Rehabilitation

## 2021-02-12 ENCOUNTER — Telehealth: Payer: Self-pay | Admitting: *Deleted

## 2021-02-12 ENCOUNTER — Telehealth: Payer: Self-pay

## 2021-02-12 LAB — PSA, TOTAL AND FREE
PSA, Free Pct: 32.5 %
PSA, Free: 0.26 ng/mL
Prostate Specific Ag, Serum: 0.8 ng/mL (ref 0.0–4.0)

## 2021-02-12 LAB — KAPPA/LAMBDA LIGHT CHAINS
Kappa free light chain: 20.9 mg/L — ABNORMAL HIGH (ref 3.3–19.4)
Kappa, lambda light chain ratio: 0.2 — ABNORMAL LOW (ref 0.26–1.65)
Lambda free light chains: 107 mg/L — ABNORMAL HIGH (ref 5.7–26.3)

## 2021-02-12 NOTE — Telephone Encounter (Signed)
Left L4 tf, 2 weeks from last injection

## 2021-02-12 NOTE — Telephone Encounter (Signed)
Called pt and sch 5/19 

## 2021-02-12 NOTE — Addendum Note (Signed)
Addended by: Adaline Sill on: 02/12/2021 01:54 PM   Modules accepted: Orders

## 2021-02-12 NOTE — Telephone Encounter (Signed)
Patient r/c in regards to his MRI. Patient is aware of his MRI date/time on 5/9 at 8 am and to arrive by 7:30 am to [facility].

## 2021-02-12 NOTE — Telephone Encounter (Signed)
Patient called. He is still in pain. He would like another injection. His call back number is [phone]

## 2021-02-12 NOTE — Telephone Encounter (Signed)
Pt called and states he is still in pain and would like a call back. [phone]

## 2021-02-12 NOTE — Telephone Encounter (Signed)
Please advise, Pt last inj was 02/07/21 Left L5-S1 TF

## 2021-02-12 NOTE — Telephone Encounter (Signed)
Attempted to call patient to provide phone number to schedule MRI [facility].  Dr Chryl Heck wants it scheduled as soon as possible.  It has been authorized already.  Left message pending call back.

## 2021-02-12 NOTE — Telephone Encounter (Signed)
Called pt and LVM #1 

## 2021-02-12 NOTE — Telephone Encounter (Signed)
Called pt and sch 5/19

## 2021-02-12 NOTE — Telephone Encounter (Signed)
Patient returned call asked for a call back.    The number to contact patient is [phone]

## 2021-02-13 ENCOUNTER — Telehealth: Payer: Self-pay | Admitting: Family Medicine

## 2021-02-13 NOTE — Telephone Encounter (Signed)
Pt wife called and states Dr. Chryl Heck order pt a contrast MRI for the specific area in pain and she was wondering does Dr. Junius Roads think pt still needs MRI for the whole body? She would like call back. [phone]

## 2021-02-13 NOTE — Telephone Encounter (Signed)
I would suggest that he ask the oncologist. I am out of town this week and won't be able to do it.

## 2021-02-13 NOTE — Telephone Encounter (Signed)
Patient states the whole body scan is scheduled tomorrow and the oncologist ordered an MRI WITH contrast She is wondering if the bone scan is still necessary?

## 2021-02-14 ENCOUNTER — Encounter (HOSPITAL_COMMUNITY): Payer: HMO

## 2021-02-14 ENCOUNTER — Telehealth: Payer: Self-pay

## 2021-02-14 ENCOUNTER — Telehealth: Payer: Self-pay | Admitting: Hematology and Oncology

## 2021-02-14 ENCOUNTER — Other Ambulatory Visit: Payer: Self-pay | Admitting: Hematology and Oncology

## 2021-02-14 ENCOUNTER — Ambulatory Visit (HOSPITAL_COMMUNITY): Payer: HMO

## 2021-02-14 DIAGNOSIS — M5440 Lumbago with sciatica, unspecified side: Secondary | ICD-10-CM

## 2021-02-14 LAB — PROTEIN ELECTROPHORESIS, SERUM, WITH REFLEX
A/G Ratio: 1.6 (ref 0.7–1.7)
Albumin ELP: 4.3 g/dL (ref 2.9–4.4)
Alpha-1-Globulin: 0.2 g/dL (ref 0.0–0.4)
Alpha-2-Globulin: 0.8 g/dL (ref 0.4–1.0)
Beta Globulin: 0.9 g/dL (ref 0.7–1.3)
Gamma Globulin: 0.8 g/dL (ref 0.4–1.8)
Globulin, Total: 2.7 g/dL (ref 2.2–3.9)
M-Spike, %: 0.6 g/dL — ABNORMAL HIGH
SPEP Interpretation: 0
Total Protein ELP: 7 g/dL (ref 6.0–8.5)

## 2021-02-14 LAB — IMMUNOFIXATION REFLEX, SERUM
IgA: 92 mg/dL (ref 61–437)
IgG (Immunoglobin G), Serum: 988 mg/dL (ref 603–1613)
IgM (Immunoglobulin M), Srm: 70 mg/dL (ref 15–143)

## 2021-02-14 NOTE — Telephone Encounter (Signed)
I tried calling patient and his wife. I received the message this morning about the bone scan. I am unable to cancel bone scan ordered by another physician. I tried to reach out to them to explain and if possible call radiology to cancel it I also called Dr Hilt's office but couldn't talk to anyone in the office. We will try to reach out to radiology as well but we cannot guarantee it will be cancelled.  Allen Hartman

## 2021-02-14 NOTE — Telephone Encounter (Signed)
IC discussed at length with patient. He verbalized understanding. Appt scheduled for him to see Dr Junius Roads.

## 2021-02-14 NOTE — Telephone Encounter (Signed)
Per Dr. Chryl Heck and patient's request - full body bone scan canceled for today. Patient reminded to keep appointment for MRI scan on 5/9 at [facility]. Patient provided with [facility] number for any additional questions or concerns.  [facility] notified of the change.

## 2021-02-14 NOTE — Telephone Encounter (Signed)
I talked with patient. It is very unclear what he was requesting.  He did however ask if you could call him to discuss. He said he was very put out with the care he had received from our office. He stated you told him that if he needed anything to call and you would make it happen.

## 2021-02-14 NOTE — Telephone Encounter (Signed)
I called Dr Ernestina Patches per patient request. The front desk answered and said the message will be conveyed to the doctor, I left my mobile number to call back Again, I asked the wife to consider coming to the ED for immediate pain management. I explained to the patient, I can only request [place], I cant do these procedure We discussed about stronger pain meds, but wife said he doesn't react well to hydrocodone. Jenniah Bhavsar

## 2021-02-14 NOTE — Telephone Encounter (Signed)
Oncology doctor called asking to speak with you about patient. You were in a procedure at the time of call. Patient has called oncology stating he was upset. Allen Hartman he is in a lot of pain and would like something to be done. You recently saw him end of April. Contact for provider is [phone]

## 2021-02-14 NOTE — Progress Notes (Signed)
I called patient to discuss labs. He was upset that he had such difficult time to get to [place]. He said the operator told them they dont know a doctor by this last name. I also talked to his wife, and explained that the labs are concerning for MM although diagnosis is not confirmed yet. I recommended a bone survey which the wife will convey to the patient. He said he is in a lot of pain, he was wondering if I call Dr Romona Curls office to expedite back injections. I also recommended that he consider coming to the ED to get things expedited, and given intractable pain,  wife expressed understanding.  Allen Hartman

## 2021-02-15 ENCOUNTER — Telehealth: Payer: Self-pay | Admitting: Hematology and Oncology

## 2021-02-15 ENCOUNTER — Other Ambulatory Visit: Payer: Self-pay | Admitting: Hematology and Oncology

## 2021-02-15 ENCOUNTER — Telehealth: Payer: Self-pay

## 2021-02-15 MED ORDER — GABAPENTIN 300 MG PO CAPS: 300.0000 mg | ORAL_CAPSULE | Freq: Three times a day (TID) | ORAL | 0 refills | Status: DC

## 2021-02-15 MED ORDER — OXYCODONE HCL 5 MG PO TABS: 5.0000 mg | ORAL_TABLET | Freq: Four times a day (QID) | ORAL | 0 refills | Status: DC | PRN

## 2021-02-15 NOTE — Telephone Encounter (Signed)
I called patient's wife per discussion with her yesterday since he is working most of the time. Patient is resting according to her. 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 Wife expressed understanding of why we need bone survey and BMB. We discussed about trying oxycodone for pain since he has intractable pain. He can also try gabapentin QHS and if tolerated well  TID. I told her he may be very sleepy with these medications. She says sleep is much needed for him now. Again, patient;s wife discussed coming to ED with patient and he refused according to her. I couldnt bring this up with him yesterday or today, he was on a national conference call yesterday, didn't answer yesterday morning when I called and is resting today after lack of sleep. She will convey the message to her husband. She was asked to stop tramadol and baclofen. She expressed understanding of all the discussion. All her questions were answered.

## 2021-02-15 NOTE — Telephone Encounter (Signed)
LVM regarding newly scheduled bone survey appointment for 5/11 at [place] patient to arrive at 9:45 am to get checked in at [facility] admitting prior to their 10:00 am scan. Provided call back number for any questions or concerns.

## 2021-02-18 ENCOUNTER — Telehealth: Payer: Self-pay | Admitting: Physical Medicine and Rehabilitation

## 2021-02-18 ENCOUNTER — Telehealth: Payer: Self-pay | Admitting: Hematology and Oncology

## 2021-02-18 ENCOUNTER — Telehealth: Payer: Self-pay

## 2021-02-18 ENCOUNTER — Ambulatory Visit (HOSPITAL_COMMUNITY)
Admission: RE | Admit: 2021-02-18 | Discharge: 2021-02-18 | Disposition: A | Discharge status: Home / Self Care | Payer: HMO | Source: Ambulatory Visit | Attending: Hematology and Oncology | Admitting: Hematology and Oncology

## 2021-02-18 ENCOUNTER — Other Ambulatory Visit: Payer: Self-pay

## 2021-02-18 DIAGNOSIS — K219 Gastro-esophageal reflux disease without esophagitis: Secondary | ICD-10-CM | POA: Diagnosis not present

## 2021-02-18 DIAGNOSIS — M5440 Lumbago with sciatica, unspecified side: Secondary | ICD-10-CM

## 2021-02-18 DIAGNOSIS — N1831 Chronic kidney disease, stage 3a: Secondary | ICD-10-CM | POA: Diagnosis not present

## 2021-02-18 DIAGNOSIS — M47816 Spondylosis without myelopathy or radiculopathy, lumbar region: Secondary | ICD-10-CM | POA: Diagnosis not present

## 2021-02-18 DIAGNOSIS — E1129 Type 2 diabetes mellitus with other diabetic kidney complication: Secondary | ICD-10-CM | POA: Diagnosis not present

## 2021-02-18 DIAGNOSIS — E785 Hyperlipidemia, unspecified: Secondary | ICD-10-CM | POA: Diagnosis not present

## 2021-02-18 DIAGNOSIS — K921 Melena: Secondary | ICD-10-CM | POA: Diagnosis not present

## 2021-02-18 DIAGNOSIS — G720 Drug-induced myopathy: Secondary | ICD-10-CM | POA: Diagnosis not present

## 2021-02-18 DIAGNOSIS — J45909 Unspecified asthma, uncomplicated: Secondary | ICD-10-CM | POA: Diagnosis not present

## 2021-02-18 DIAGNOSIS — I129 Hypertensive chronic kidney disease with stage 1 through stage 4 chronic kidney disease, or unspecified chronic kidney disease: Secondary | ICD-10-CM | POA: Diagnosis not present

## 2021-02-18 DIAGNOSIS — R82998 Other abnormal findings in urine: Secondary | ICD-10-CM | POA: Diagnosis not present

## 2021-02-18 DIAGNOSIS — N400 Enlarged prostate without lower urinary tract symptoms: Secondary | ICD-10-CM | POA: Diagnosis not present

## 2021-02-18 DIAGNOSIS — M898X8 Other specified disorders of bone, other site: Secondary | ICD-10-CM | POA: Diagnosis not present

## 2021-02-18 DIAGNOSIS — M1990 Unspecified osteoarthritis, unspecified site: Secondary | ICD-10-CM | POA: Diagnosis not present

## 2021-02-18 DIAGNOSIS — Z9889 Other specified postprocedural states: Secondary | ICD-10-CM | POA: Diagnosis not present

## 2021-02-18 DIAGNOSIS — Z0000 Encounter for general adult medical examination without abnormal findings: Secondary | ICD-10-CM | POA: Diagnosis not present

## 2021-02-18 DIAGNOSIS — I493 Ventricular premature depolarization: Secondary | ICD-10-CM | POA: Diagnosis not present

## 2021-02-18 DIAGNOSIS — M5418 Radiculopathy, sacral and sacrococcygeal region: Secondary | ICD-10-CM | POA: Diagnosis not present

## 2021-02-18 MED ORDER — GADOBUTROL 1 MMOL/ML IV SOLN
6.0000 mL | Freq: Once | INTRAVENOUS | Status: AC | PRN
  Administered 2021-02-18: 6 mL via INTRAVENOUS

## 2021-02-18 NOTE — Telephone Encounter (Signed)
I called patient and discussed MRI results. There is no clear evidence of lesions to explain his back pain. L4 lesions is still thought to be indeterminate, with normal height of vertebra and no fractures. We discussed about proceeding with bone survey, but he says he has made his mind to go to [facility] for the conference since his pain is better controlled on oxycodone and gabapentin. He will make a decision today. I have asked him to let us know so we can reschedule his bone survey and BMB accordingly We will plan to give him a call later today to check on this.  Jenah Vanasten

## 2021-02-18 NOTE — Telephone Encounter (Signed)
Patient called requesting to reschedule appt. Please call pt at [phone].

## 2021-02-18 NOTE — Telephone Encounter (Signed)
Called pt and r/s 

## 2021-02-18 NOTE — Telephone Encounter (Signed)
Patient communicated that he would be going out of town until 5/19 and would need his current appointments to be rescheduled. Patient's bone survey was rescheduled to Monday, May 23rd at 10:00 am at [facility]. Patient aware and knows to arrive 15 minutes early to be checked-in.  Encouraged patient to reach out to other providers to inform them that he would need to reschedule additional appointments outside of the [place]. Patient aware and agrees to contact them.

## 2021-02-19 ENCOUNTER — Telehealth: Payer: Self-pay | Admitting: Hematology and Oncology

## 2021-02-19 ENCOUNTER — Other Ambulatory Visit: Payer: Self-pay | Admitting: Hematology and Oncology

## 2021-02-19 ENCOUNTER — Encounter: Payer: Self-pay | Admitting: Family Medicine

## 2021-02-19 ENCOUNTER — Ambulatory Visit: Payer: HMO | Admitting: Physical Medicine and Rehabilitation

## 2021-02-19 ENCOUNTER — Other Ambulatory Visit: Payer: Self-pay

## 2021-02-19 ENCOUNTER — Ambulatory Visit (INDEPENDENT_AMBULATORY_CARE_PROVIDER_SITE_OTHER): Payer: HMO | Admitting: Family Medicine

## 2021-02-19 DIAGNOSIS — M5440 Lumbago with sciatica, unspecified side: Secondary | ICD-10-CM

## 2021-02-19 DIAGNOSIS — M5442 Lumbago with sciatica, left side: Secondary | ICD-10-CM | POA: Diagnosis not present

## 2021-02-19 MED ORDER — OXYCODONE HCL 5 MG PO TABS: 5.0000 mg | ORAL_TABLET | ORAL | 0 refills | Status: DC | PRN

## 2021-02-19 MED ORDER — OXYCODONE HCL 5 MG PO TABS: 5.0000 mg | ORAL_TABLET | Freq: Four times a day (QID) | ORAL | 0 refills | Status: DC | PRN

## 2021-02-19 NOTE — Progress Notes (Signed)
   Office Visit Note   Patient: Allen Hartman.           Date of Birth: 14-Jan-1946           MRN: [id number] Visit Date: 02/19/2021 Requested by: Prince Solian, MD [address] [place],  [place] [address] PCP: Prince Solian, MD  Subjective: Chief Complaint  Patient presents with  . Lower Back - Pain, Follow-up    Medication has "calmed the pain." Would like to discuss treatment plan.    HPI: He is here for follow-up low back and left leg pain.  Epidural injection helped for maybe a day, but then the pain came back.  He feels like the pain and numbness radiate into the heel and lateral side of his foot.  He is scheduled for another epidural later this month and also another body scan.  He had an MRI with contrast which appeared to show a benign lesion in the L4 vertebra.  He is using gabapentin at night and that helps, oxycodone helps during the day and he takes it about 3-4 times per day.  He will be flying to [place] tomorrow for a scheduled vacation.               ROS:   All other systems were reviewed and are negative.  Objective: Vital Signs: There were no vitals taken for this visit.  Physical Exam:  General:  Alert and oriented, in no acute distress. Pulm:  Breathing unlabored. Psy:  Normal mood, congruent affect.  Left leg: Strength remains normal.  Imaging: No results found.  Assessment & Plan: 1.  Left leg radiculopathy, seems more like S1. -Refilled oxycodone.  When he returns from his trip he will have his epidural injection.  He will also try chiropractic per Dr. Charise Carwin.  In the past he has done well with chiropractic per Dr. Lequita Halt, who is now retired.     Procedures: No procedures performed        PMFS History: There are no problems to display for this patient.  Past Medical History:  Diagnosis Date  . Hypertension     No family history on file.  No past surgical history on file. Social History   Occupational History  .  Not on file  Tobacco Use  . Smoking status: Not on file  . Smokeless tobacco: Not on file  Substance and Sexual Activity  . Alcohol use: Not on file  . Drug use: Not on file  . Sexual activity: Not on file

## 2021-02-19 NOTE — Telephone Encounter (Signed)
Scheduled appt per 5/10 sch msg. Pt aware.  

## 2021-02-20 ENCOUNTER — Ambulatory Visit (HOSPITAL_COMMUNITY): Payer: HMO

## 2021-02-21 ENCOUNTER — Ambulatory Visit: Payer: HMO | Admitting: Physical Medicine and Rehabilitation

## 2021-02-21 ENCOUNTER — Telehealth: Payer: Self-pay | Admitting: Family Medicine

## 2021-02-21 NOTE — Telephone Encounter (Signed)
Patient called. Says he did not get his pain medication. Would like Terri to call him. His call back number is [phone].

## 2021-02-22 ENCOUNTER — Telehealth: Payer: Self-pay | Admitting: Family Medicine

## 2021-02-22 MED ORDER — OXYCODONE HCL 5 MG PO TABS: 5.0000 mg | ORAL_TABLET | ORAL | 0 refills | Status: DC | PRN

## 2021-02-22 NOTE — Telephone Encounter (Signed)
I called and reached the patient's voice mail -- left message to call me back.

## 2021-02-22 NOTE — Telephone Encounter (Signed)
Please send a new Rx to the pharmacy in the message. You may need to put that it is to be filled today in the signature of the script.

## 2021-02-22 NOTE — Telephone Encounter (Signed)
Pt wanted to leave the address for the [facility] on [address] in [place]. The phone number is below  [phone]

## 2021-02-22 NOTE — Telephone Encounter (Signed)
Rx sent 

## 2021-02-22 NOTE — Telephone Encounter (Signed)
I spoke with the patient: he was unable to get the oxycodone while still in [place] - insurance rejected the Rx Dr. Junius Roads wrote on 02/19/21 and the one Dr. Chryl Heck wrote on the same day (I called and spoke with the pharmacist at [facility] also). The Rx cannot be transferred out to [place], where the patient is currently. I explained this to the patient and asked him if there is a Writer or another pharmacy where Dr. Junius Roads can send in a prescription. He is going to call back with this information.

## 2021-02-22 NOTE — Telephone Encounter (Signed)
Err

## 2021-02-22 NOTE — Telephone Encounter (Signed)
I called and advised the patient the Rx has been sent in to the pharmacy in [place]. I did also advise him that per the pharmacist at his [facility] here in [place], the insurance might still reject it. If this happens, he may need to pay cash and then get reimbursed by the insurance. The patient voiced understanding.

## 2021-02-26 ENCOUNTER — Telehealth: Payer: Self-pay | Admitting: Hematology and Oncology

## 2021-02-26 ENCOUNTER — Telehealth: Payer: HMO | Admitting: Hematology and Oncology

## 2021-02-26 NOTE — Telephone Encounter (Signed)
Scheduled appt per 5/17 sch msg. Called pt, no answer. Left msg with appt date and time.  

## 2021-02-28 ENCOUNTER — Ambulatory Visit: Payer: HMO | Admitting: Physical Medicine and Rehabilitation

## 2021-03-04 ENCOUNTER — Other Ambulatory Visit: Payer: Self-pay

## 2021-03-04 ENCOUNTER — Ambulatory Visit (HOSPITAL_COMMUNITY)
Admission: RE | Admit: 2021-03-04 | Discharge: 2021-03-04 | Disposition: A | Discharge status: Home / Self Care | Payer: HMO | Source: Ambulatory Visit | Attending: Hematology and Oncology | Admitting: Hematology and Oncology

## 2021-03-04 DIAGNOSIS — M5440 Lumbago with sciatica, unspecified side: Secondary | ICD-10-CM | POA: Diagnosis not present

## 2021-03-04 DIAGNOSIS — C9000 Multiple myeloma not having achieved remission: Secondary | ICD-10-CM | POA: Diagnosis not present

## 2021-03-05 ENCOUNTER — Encounter: Payer: Self-pay | Admitting: Physical Medicine and Rehabilitation

## 2021-03-05 ENCOUNTER — Ambulatory Visit: Payer: Self-pay

## 2021-03-05 ENCOUNTER — Ambulatory Visit (INDEPENDENT_AMBULATORY_CARE_PROVIDER_SITE_OTHER): Payer: HMO | Admitting: Physical Medicine and Rehabilitation

## 2021-03-05 VITALS — BP 119/75 | HR 94

## 2021-03-05 DIAGNOSIS — M5416 Radiculopathy, lumbar region: Secondary | ICD-10-CM | POA: Diagnosis not present

## 2021-03-05 DIAGNOSIS — M961 Postlaminectomy syndrome, not elsewhere classified: Secondary | ICD-10-CM

## 2021-03-05 MED ORDER — METHYLPREDNISOLONE ACETATE 80 MG/ML IJ SUSP
80.0000 mg | Freq: Once | INTRAMUSCULAR | Status: AC
  Administered 2021-03-05: 80 mg

## 2021-03-05 NOTE — Progress Notes (Signed)
Pt state lower back pain that travels to his buttocks and down his left leg to the heel and side foot. Pt state sitting makes the pain worse. Pt state he takes pain meds to help ease his pain. Pt has hx of inj on 02/07/21 pt state it helped.  Numeric Pain Rating Scale and Functional Assessment Average Pain 5   In the last MONTH (on 0-10 scale) has pain interfered with the following?  1. General activity like being  able to carry out your everyday physical activities such as walking, climbing stairs, carrying groceries, or moving a chair?  Rating(8)   +Driver, -BT, -Dye Allergies.

## 2021-03-05 NOTE — Patient Instructions (Signed)

## 2021-03-05 NOTE — Progress Notes (Signed)
Allen Hartman. - 75 y.o. male MRN [id number]  Date of birth: 1946/09/26  Office Visit Note: Visit Date: 03/05/2021 PCP: Prince Solian, MD Referred by: Prince Solian, MD  Subjective: Chief Complaint  Patient presents with  . Lower Back - Pain  . Left Leg - Pain  . Left Foot - Pain   HPI:  Allen Hartman. is a 75 y.o. male who comes in today For planned left S1 transforaminal epidural steroid injection.  Prior L5 injection gave him great relief for 1 day and then after that may be a sustained 25% relief.  We had worked the patient in earlier because of the great deal of pain he was describing on the phone.  He had to reschedule at work and because of a trip to [place].  He continues to have left radicular leg pain posterior laterally to the foot more of an S1 distribution.  His wife noted that the symptoms of the top of the foot have actually improved and now it is more to the side and bottom of the foot.  Since our last time we saw him he has had MRI with contrast showing scar tissue around the S1 nerve root.  His surgery which was a lumbar discectomy laminectomy was over 11 years ago but the scar tissue around this area does seem to fit with his symptoms.  He has also had a bone scan for myeloma staging and this was negative for any lytic lesions.  He will continue to follow-up with his oncologist and the blood work is seem to be less likely that he does have myeloma.  He has had no new symptoms since we have seen him no bowel or bladder dysfunction etc.  If he does not get much relief would consider spinal cord stimulator trial or regrouping with spine surgeon.  ROS Otherwise per HPI.  Assessment & Plan: Visit Diagnoses:    ICD-10-CM   1. Lumbar radiculopathy  M54.16 XR C-ARM NO REPORT    Epidural Steroid injection    methylPREDNISolone acetate (DEPO-MEDROL) injection 80 mg  2. Post laminectomy syndrome  M96.1     Plan: No additional findings.   Meds & Orders:  Meds  ordered this encounter  Medications  . methylPREDNISolone acetate (DEPO-MEDROL) injection 80 mg    Orders Placed This Encounter  Procedures  . XR C-ARM NO REPORT  . Epidural Steroid injection    Follow-up: Return for visit to requesting physician as needed.   Procedures: No procedures performed  S1 Lumbosacral Transforaminal Epidural Steroid Injection - Sub-Pedicular Approach with Fluoroscopic Guidance   Patient: Allen Hartman.      Date of Birth: 19-Mar-1946 MRN: [id number] PCP: Prince Solian, MD      Visit Date: 03/05/2021   Universal Protocol:    Date/Time: 05/25/225:18 AM  Consent Given By: the patient  Position:  PRONE  Additional Comments: Vital signs were monitored before and after the procedure. Patient was prepped and draped in the usual sterile fashion. The correct patient, procedure, and site was verified.   Injection Procedure Details:  Procedure Site One Meds Administered:  Meds ordered this encounter  Medications  . methylPREDNISolone acetate (DEPO-MEDROL) injection 80 mg    Laterality: Left  Location/Site:  S1 Foramen   Needle size: 22 ga.  Needle type: Spinal  Needle Placement: Transforaminal  Findings:   -Comments: Excellent flow of contrast along the nerve, nerve root and into the epidural space.  Epidurogram: Contrast epidurogram showed no  nerve root cut off or restricted flow pattern.  Procedure Details: After squaring off the sacral end-plate to get a true AP view, the C-arm was positioned so that the best possible view of the S1 foramen was visualized. The soft tissues overlying this structure were infiltrated with 2-3 ml. of 1% Lidocaine without Epinephrine.    The spinal needle was inserted toward the target using a "trajectory" view along the fluoroscope beam.  Under AP and lateral visualization, the needle was advanced so it did not puncture dura. Biplanar projections were used to confirm position. Aspiration was confirmed  to be negative for CSF and/or blood. A 1-2 ml. volume of Isovue-250 was injected and flow of contrast was noted at each level. Radiographs were obtained for documentation purposes.   After attaining the desired flow of contrast documented above, a 0.5 to 1.0 ml test dose of 0.25% Marcaine was injected into each respective transforaminal space.  The patient was observed for 90 seconds post injection.  After no sensory deficits were reported, and normal lower extremity motor function was noted,   the above injectate was administered so that equal amounts of the injectate were placed at each foramen (level) into the transforaminal epidural space.   Additional Comments:  The patient tolerated the procedure well Dressing: Band-Aid with 2 x 2 sterile gauze    Post-procedure details: Patient was observed during the procedure. Post-procedure instructions were reviewed.  Patient left the clinic in stable condition.     Clinical History: MRI LUMBAR SPINE WITHOUT CONTRAST  TECHNIQUE: Multiplanar, multisequence MR imaging of the lumbar spine was performed. No intravenous contrast was administered.  COMPARISON:  MRI lumbar spine 06/06/2010  FINDINGS: Segmentation: 5 non rib-bearing lumbar type vertebral bodies are present. The lowest fully formed vertebral body is L5.  Alignment: No significant listhesis is present. Straightening of the normal lumbar lordosis is similar the prior exam.  Vertebrae: Indeterminate T2 hyperintense lesion is present at L4 measuring up to 13 mm. No other focal lesions are present. Marrow signal and vertebral body heights are otherwise normal.  Conus medullaris and cauda equina: Conus extends to the L1-2 level. Conus and cauda equina appear normal.  Paraspinal and other soft tissues: Limited imaging the abdomen is unremarkable. There is no significant adenopathy. No solid organ lesions are present. Paraspinous musculature is within  normal limits.  Disc levels:  L1-2: Mild broad-based disc bulging without significant stenosis.  L2-3: Broad-based disc bulge noted. Mild facet hypertrophy is present bilaterally. This leads to mild foraminal narrowing bilaterally.  L3-4: A broad-based disc bulge is present. Mild facet hypertrophy is noted bilaterally. Moderate right and mild left foraminal narrowing is present.  L4-5: Shallow right paramedian disc protrusion is healed since the prior study. No significant central canal stenosis is present. Moderate bilateral foraminal stenosis has progressed.  L5-S1: Left laminectomy is present. Central canal is decompressed. The right foramen is patent. Mild left foraminal narrowing persists.  IMPRESSION: 1. Moderate bilateral foraminal stenosis at L4-5 has progressed. This is the most significant level. 2. Left laminectomy at L5-S1 with decompression of the central canal. Mild left foraminal narrowing persists. 3. Moderate right and mild left foraminal stenosis at L3-4 is new. 4. Mild foraminal narrowing bilaterally at L2-3.   Electronically Signed   By: San Morelle M.D.   On: 02/01/2021 17:35     Objective:  VS:  HT:    WT:   BMI:     BP:119/75  HR:94bpm  TEMP: ( )  RESP:  Physical Exam Vitals and nursing note reviewed.  Constitutional:      General: He is not in acute distress.    Appearance: Normal appearance. He is not ill-appearing.  HENT:     Head: Normocephalic and atraumatic.     Right Ear: External ear normal.     Left Ear: External ear normal.     Nose: No congestion.  Eyes:     Extraocular Movements: Extraocular movements intact.  Cardiovascular:     Rate and Rhythm: Normal rate.     Pulses: Normal pulses.  Pulmonary:     Effort: Pulmonary effort is normal. No respiratory distress.  Abdominal:     General: There is no distension.     Palpations: Abdomen is soft.  Musculoskeletal:        General: No tenderness or signs of  injury.     Cervical back: Neck supple.     Right lower leg: No edema.     Left lower leg: No edema.     Comments: Patient has good distal strength without clonus.  Positive dysesthesias in the left L5 and S1 dermatome.  Positive slump test.  Skin:    Findings: No erythema or rash.  Neurological:     General: No focal deficit present.     Mental Status: He is alert and oriented to person, place, and time.     Sensory: No sensory deficit.     Motor: No weakness or abnormal muscle tone.     Coordination: Coordination normal.  Psychiatric:        Mood and Affect: Mood normal.        Behavior: Behavior normal.      Imaging: XR C-ARM NO REPORT  Result Date: 03/05/2021 Please see Notes tab for imaging impression.

## 2021-03-06 DIAGNOSIS — M5418 Radiculopathy, sacral and sacrococcygeal region: Secondary | ICD-10-CM | POA: Insufficient documentation

## 2021-03-06 DIAGNOSIS — M533 Sacrococcygeal disorders, not elsewhere classified: Secondary | ICD-10-CM | POA: Insufficient documentation

## 2021-03-06 DIAGNOSIS — S3992XA Unspecified injury of lower back, initial encounter: Secondary | ICD-10-CM | POA: Insufficient documentation

## 2021-03-06 DIAGNOSIS — E139 Other specified diabetes mellitus without complications: Secondary | ICD-10-CM | POA: Insufficient documentation

## 2021-03-06 NOTE — Procedures (Signed)
S1 Lumbosacral Transforaminal Epidural Steroid Injection - Sub-Pedicular Approach with Fluoroscopic Guidance   Patient: Allen Hartman.      Date of Birth: 1946/09/10 MRN: [id number] PCP: Prince Solian, MD      Visit Date: 03/05/2021   Universal Protocol:    Date/Time: 05/25/225:18 AM  Consent Given By: the patient  Position:  PRONE  Additional Comments: Vital signs were monitored before and after the procedure. Patient was prepped and draped in the usual sterile fashion. The correct patient, procedure, and site was verified.   Injection Procedure Details:  Procedure Site One Meds Administered:  Meds ordered this encounter  Medications  . methylPREDNISolone acetate (DEPO-MEDROL) injection 80 mg    Laterality: Left  Location/Site:  S1 Foramen   Needle size: 22 ga.  Needle type: Spinal  Needle Placement: Transforaminal  Findings:   -Comments: Excellent flow of contrast along the nerve, nerve root and into the epidural space.  Epidurogram: Contrast epidurogram showed no nerve root cut off or restricted flow pattern.  Procedure Details: After squaring off the sacral end-plate to get a true AP view, the C-arm was positioned so that the best possible view of the S1 foramen was visualized. The soft tissues overlying this structure were infiltrated with 2-3 ml. of 1% Lidocaine without Epinephrine.    The spinal needle was inserted toward the target using a "trajectory" view along the fluoroscope beam.  Under AP and lateral visualization, the needle was advanced so it did not puncture dura. Biplanar projections were used to confirm position. Aspiration was confirmed to be negative for CSF and/or blood. A 1-2 ml. volume of Isovue-250 was injected and flow of contrast was noted at each level. Radiographs were obtained for documentation purposes.   After attaining the desired flow of contrast documented above, a 0.5 to 1.0 ml test dose of 0.25% Marcaine was injected into  each respective transforaminal space.  The patient was observed for 90 seconds post injection.  After no sensory deficits were reported, and normal lower extremity motor function was noted,   the above injectate was administered so that equal amounts of the injectate were placed at each foramen (level) into the transforaminal epidural space.   Additional Comments:  The patient tolerated the procedure well Dressing: Band-Aid with 2 x 2 sterile gauze    Post-procedure details: Patient was observed during the procedure. Post-procedure instructions were reviewed.  Patient left the clinic in stable condition.

## 2021-03-07 ENCOUNTER — Inpatient Hospital Stay: Payer: HMO

## 2021-03-08 ENCOUNTER — Encounter: Payer: Self-pay | Admitting: Hematology and Oncology

## 2021-03-08 ENCOUNTER — Telehealth: Payer: Self-pay | Admitting: Hematology and Oncology

## 2021-03-08 NOTE — Telephone Encounter (Signed)
I tried calling both his and his wife mobile numbers to discuss about the mis understanding with BMB NO answer Will see him on Tuesday.  Annisa Mazzarella

## 2021-03-12 ENCOUNTER — Inpatient Hospital Stay (HOSPITAL_BASED_OUTPATIENT_CLINIC_OR_DEPARTMENT_OTHER): Payer: HMO | Admitting: Hematology and Oncology

## 2021-03-12 ENCOUNTER — Other Ambulatory Visit: Payer: Self-pay

## 2021-03-12 VITALS — BP 151/76 | HR 85 | Temp 97.8°F | Resp 18 | Wt 135.5 lb

## 2021-03-12 DIAGNOSIS — M899 Disorder of bone, unspecified: Secondary | ICD-10-CM | POA: Diagnosis not present

## 2021-03-12 DIAGNOSIS — D472 Monoclonal gammopathy: Secondary | ICD-10-CM | POA: Diagnosis not present

## 2021-03-12 MED ORDER — OXYCODONE HCL 5 MG PO TABS: 5.0000 mg | ORAL_TABLET | Freq: Four times a day (QID) | ORAL | 0 refills | Status: AC | PRN

## 2021-03-12 NOTE — Progress Notes (Signed)
[facility] CONSULT NOTE  Patient Care Team: Prince Solian, MD as PCP - General (Internal Medicine)  CHIEF COMPLAINTS/PURPOSE OF CONSULTATION:  Suspicion for cancer, follow up after additional investigation.  ASSESSMENT & PLAN:   No problem-specific Assessment & Plan notes found for this encounter.  Orders Placed This Encounter  Procedures  . CBC with Differential/Platelet    Standing Status:   Standing    Number of Occurrences:   22    Standing Expiration Date:   03/12/2022  . Lactate dehydrogenase    Standing Status:   Future    Standing Expiration Date:   03/12/2022  . Beta 2 microglobulin, serum    Standing Status:   Future    Standing Expiration Date:   03/12/2022  . C-reactive protein    Standing Status:   Future    Standing Expiration Date:   03/12/2022   This is a very pleasant 75 yr old male patient with PMH significant for HTN referred to hematology for evaluation of acute low back pain, indeterminate lesion in L4 MRI lumbar spine and some weight loss, referred to oncology for further recommendations.  1. MGUS, Ig G Lambda subtype, abnormal K/L ratio. Mildly elevated creatinine. Hb mildly low. Given abnormal K/L ratio, I recommended proceeding with BMB. We will reschedule this, he said he was under the impression that he may not have to do it since his bone survey results were good. We went through the recommendations for MGUS evaluation, he is agreeable to reschedule it.  2. Family history of ovarian cancer.Recommended genetic testing once acute episode of back pain improves He is agreeable to this, referral made.  3. Low back pain likely from sciatica, improved remarkably compared to previous visit. We discussed that this is unrelated to MGUS, there is no correlating lesion. He request a small refill of oxycodone, I will refill it this once, future refills may have to be obtained by his pain doc or his PCP He expressed understanding.  RTC in 6  months unless BMB shows some abnormality.  HISTORY OF PRESENTING ILLNESS:   Allen Hartman. 75 y.o. male is here because of suspicion for cancer and an indeterminate lesion found on most recent MRI without contrast of his lumbar spine.  Interval History  Since last visit, back pain is significantly better He was taking oxycodone every 8 hrs, now he takes one every 1/2 days He doesn't use much gabapentin, may be once QHS PRN. He is able to do much more activity,  He is happy that he was able to attend the meetings in [place] and [place]. No other complaints since last visit. Rest of the pertinent 10 point ROS reviewed and neg.  MEDICAL HISTORY:  Past Medical History:  Diagnosis Date  . Hypertension     SURGICAL HISTORY: No past surgical history on file.  SOCIAL HISTORY: Social History   Socioeconomic History  . Marital status: Married    Spouse name: Not on file  . Number of children: Not on file  . Years of education: Not on file  . Highest education level: Not on file  Occupational History  . Not on file  Tobacco Use  . Smoking status: Not on file  . Smokeless tobacco: Not on file  Substance and Sexual Activity  . Alcohol use: Not on file  . Drug use: Not on file  . Sexual activity: Not on file  Other Topics Concern  . Not on file  Social History Narrative  .  Not on file   Social Determinants of Health   Financial Resource Strain: Not on file  Food Insecurity: Not on file  Transportation Needs: Not on file  Physical Activity: Not on file  Stress: Not on file  Social Connections: Not on file  Intimate Partner Violence: Not on file    FAMILY HISTORY: No family history on file.  ALLERGIES:  is allergic to sulfa antibiotics.  MEDICATIONS:  Current Outpatient Medications  Medication Sig Dispense Refill  . baclofen (LIORESAL) 10 MG tablet Take 0.5-1 tablets (5-10 mg total) by mouth 3 (three) times daily as needed for muscle spasms. 30 each 3  . cetirizine  (ZYRTEC) 10 MG tablet     . CONCERTA 27 MG CR tablet Take 27 mg by mouth every morning.    . Diclofenac Sodium 3 % GEL Apply topically.    . gabapentin (NEURONTIN) 300 MG capsule Take 1 capsule (300 mg total) by mouth 3 (three) times daily. 30 capsule 0  . insulin glargine (LANTUS SOLOSTAR) 100 UNIT/ML Solostar Pen INJECT 5-7 UNITS [place] Q EVENING BEFORE BED    . irbesartan (AVAPRO) 300 MG tablet Take 300 mg by mouth daily.    Marland Kitchen KOMBIGLYZE XR 2.02-999 MG TB24 Take 1 tablet by mouth 2 (two) times daily.    . ondansetron (ZOFRAN-ODT) 8 MG disintegrating tablet Take 8 mg by mouth every 8 (eight) hours as needed.    Glory Rosebush VERIO test strip 3 (three) times daily.    Marland Kitchen oxyCODONE (ROXICODONE) 5 MG immediate release tablet Take 1 tablet (5 mg total) by mouth every 4 (four) hours as needed for moderate pain or severe pain. 40 tablet 0  . pantoprazole (PROTONIX) 40 MG tablet Take 1 tablet by mouth 2 (two) times daily.    . tamsulosin (FLOMAX) 0.4 MG CAPS capsule SMARTSIG:1 Capsule(s) By Mouth Every Evening    . traMADol (ULTRAM) 50 MG tablet Take by mouth.     No current facility-administered medications for this visit.     PHYSICAL EXAMINATION:  ECOG PERFORMANCE STATUS: 2 - Symptomatic, <50% confined to bed  Vitals:   03/12/21 1256  BP: (!) 151/76  Pulse: 85  Resp: 18  Temp: 97.8 F (36.6 C)  SpO2: 100%   Filed Weights   03/12/21 1256  Weight: 135 lb 8 oz (61.5 kg)   He was alert, oriented, able to sit through the conversation. PE deferred in lieu of counseling  LABORATORY DATA:  I have reviewed the data as listed Lab Results  Component Value Date   WBC 8.9 02/11/2021   HGB 12.6 (L) 02/11/2021   HCT 38.8 (L) 02/11/2021   MCV 84.3 02/11/2021   PLT 497 (H) 02/11/2021     Chemistry      Component Value Date/Time   NA 136 02/11/2021 1210   K 4.9 02/11/2021 1210   CL 93 (L) 02/11/2021 1210   CO2 31 02/11/2021 1210   BUN 35 (H) 02/11/2021 1210   CREATININE 1.41 (H)  02/11/2021 1210      Component Value Date/Time   CALCIUM 10.4 (H) 02/11/2021 1210   ALKPHOS 70 02/11/2021 1210   AST 15 02/11/2021 1210   ALT 10 02/11/2021 1210   BILITOT 0.4 02/11/2021 1210       RADIOGRAPHIC STUDIES: I have personally reviewed the radiological images as listed and agreed with the findings in the report. Epidural Steroid injection  Result Date: 03/05/2021 Magnus Sinning, MD     03/06/2021  5:19 AM S1 Lumbosacral  Transforaminal Epidural Steroid Injection - Sub-Pedicular Approach with Fluoroscopic Guidance Patient: Allen Hartman.     Date of Birth: [birth year] MRN: [id number] PCP: Prince Solian, MD     Visit Date: 03/05/2021  Universal Protocol:   Date/Time: 05/25/225:18 AM Consent Given By: the patient Position:  PRONE Additional Comments: Vital signs were monitored before and after the procedure. Patient was prepped and draped in the usual sterile fashion. The correct patient, procedure, and site was verified. Injection Procedure Details: Procedure Site One Meds Administered: Meds ordered this encounter Medications . methylPREDNISolone acetate (DEPO-MEDROL) injection 80 mg Laterality: Left Location/Site: S1 Foramen Needle size: 22 ga. Needle type: Spinal Needle Placement: Transforaminal Findings:  -Comments: Excellent flow of contrast along the nerve, nerve root and into the epidural space. Epidurogram: Contrast epidurogram showed no nerve root cut off or restricted flow pattern. Procedure Details: After squaring off the sacral end-plate to get a true AP view, the C-arm was positioned so that the best possible view of the S1 foramen was visualized. The soft tissues overlying this structure were infiltrated with 2-3 ml. of 1% Lidocaine without Epinephrine. The spinal needle was inserted toward the target using a "trajectory" view along the fluoroscope beam.  Under AP and lateral visualization, the needle was advanced so it did not puncture dura. Biplanar projections were used  to confirm position. Aspiration was confirmed to be negative for CSF and/or blood. A 1-2 ml. volume of Isovue-250 was injected and flow of contrast was noted at each level. Radiographs were obtained for documentation purposes. After attaining the desired flow of contrast documented above, a 0.5 to 1.0 ml test dose of 0.25% Marcaine was injected into each respective transforaminal space.  The patient was observed for 90 seconds post injection.  After no sensory deficits were reported, and normal lower extremity motor function was noted,   the above injectate was administered so that equal amounts of the injectate were placed at each foramen (level) into the transforaminal epidural space. Additional Comments: The patient tolerated the procedure well Dressing: Band-Aid with 2 x 2 sterile gauze  Post-procedure details: Patient was observed during the procedure. Post-procedure instructions were reviewed. Patient left the clinic in stable condition.   MR Lumbar Spine W Wo Contrast  Result Date: 02/18/2021 CLINICAL DATA:  Follow-up indeterminate L4 lesion EXAM: MRI LUMBAR SPINE WITHOUT AND WITH CONTRAST TECHNIQUE: Multiplanar and multiecho pulse sequences of the lumbar spine were obtained without and with intravenous contrast. CONTRAST:  1m GADAVIST GADOBUTROL 1 MMOL/ML IV SOLN COMPARISON:  02/01/2021 FINDINGS: Segmentation:  Standard. Alignment:  Stable. Vertebrae: Stable vertebral body heights. Small enhancing lesion of the L4 vertebral body is stable in size. There are no aggressive features. No substantial marrow edema. Conus medullaris and cauda equina: Conus extends to the L1-L2 level. Conus and cauda equina appear normal. Paraspinal and other soft tissues: Unremarkable. Disc levels: Degenerative changes are stable in appearance. There is no significant canal stenosis. Left laminectomy at L5. There is enhancing tissue within the left subarticular recess extending below disc level along the S1 nerve root.  IMPRESSION: Stable indeterminate L4 vertebral body lesion compared to recent prior study. Postoperative changes at L5-S1 with scar tissue along the descending left S1 nerve root. Electronically Signed   By: PMacy MisM.D.   On: 02/18/2021 09:17   DG Bone Survey Met  Result Date: 03/06/2021 CLINICAL DATA:  Myeloma, staging examination EXAM: METASTATIC BONE SURVEY COMPARISON:  None. FINDINGS: No focal lytic lesions are identified within the visualized axial  and appendicular skeleton. Degenerative changes are noted within the acromioclavicular joints bilaterally. Degenerative changes are seen within the cervical spine at C2-C6, most severe at C3-4. Mild degenerative changes are seen throughout the thoracic spine. IMPRESSION: No focal lytic lesions identified. Multifocal degenerative changes as described above. Electronically Signed   By: Fidela Salisbury MD   On: 03/06/2021 04:45   XR C-ARM NO REPORT  Result Date: 03/05/2021 Please see Notes tab for imaging impression.  03/04/2021  IMPRESSION: No focal lytic lesions identified. Multifocal degenerative changes as described above.  02/18/2021  IMPRESSION: Stable indeterminate L4 vertebral body lesion compared to recent prior study.  Postoperative changes at L5-S1 with scar tissue along the descending left S1 nerve root.  According to my discussion with radiology, he felt this is likely a hemangioma.  Labs review  Anemia, Hb of 12.6, plt 497 SPEP, M spike of 0.6 g/dl, Ig G lambda Creatinine of 1.4, calcium of 10.4, BUN of 35 K/L ratio of 0.2 PSA normal.  All questions were answered. The patient knows to call the clinic with any problems, questions or concerns.  I spent 30 minutes in the care of this patient including history, reviewing MGUS and evaluation recommendations, discussed about BMB and its value in distinguishing MGUS from MM and SMM, reviewed pain management follow up and genetic testing recommendations.    Benay Pike, MD 03/12/2021 1:54 PM

## 2021-03-13 ENCOUNTER — Telehealth: Payer: Self-pay | Admitting: Hematology and Oncology

## 2021-03-13 ENCOUNTER — Encounter: Payer: Self-pay | Admitting: Hematology and Oncology

## 2021-03-13 NOTE — Telephone Encounter (Signed)
Scheduled appts per 6/1 sch msg. Pt aware.  

## 2021-03-18 ENCOUNTER — Other Ambulatory Visit: Payer: Self-pay

## 2021-03-18 ENCOUNTER — Encounter: Payer: Self-pay | Admitting: Family Medicine

## 2021-03-18 ENCOUNTER — Ambulatory Visit (INDEPENDENT_AMBULATORY_CARE_PROVIDER_SITE_OTHER): Payer: HMO | Admitting: Family Medicine

## 2021-03-18 DIAGNOSIS — M5442 Lumbago with sciatica, left side: Secondary | ICD-10-CM

## 2021-03-18 MED ORDER — NABUMETONE 500 MG PO TABS: 500.0000 mg | ORAL_TABLET | Freq: Two times a day (BID) | ORAL | 3 refills | Status: DC | PRN

## 2021-03-18 NOTE — Progress Notes (Signed)
   Office Visit Note   Patient: Allen Hartman.           Date of Birth: [birth year]           MRN: [id number] Visit Date: 03/18/2021 Requested by: Prince Solian, MD [address] [place],  [place] [address] PCP: Prince Solian, MD  Subjective: Chief Complaint  Patient presents with  . Lower Back - Pain    Continues to have pain in the lower back and down the left leg to the foot. Pain is 50 - 60% better. Wants to discuss plan of action now. The 2nd ESI helped even more than the 1st one.     HPI: He is here for follow-up low back and left leg pain.  After 2 epidural injections, he is making some progress.  Pain is about 50 to 60% better.  He is able to tolerate pain without medications for the most part, but in the mornings sometimes he wishes he had something.  He prefers not to take something that affects his brain function.  He is scheduled for bone marrow biopsy this coming Friday.               ROS:   All other systems were reviewed and are negative.  Objective: Vital Signs: There were no vitals taken for this visit.  Physical Exam:  General:  Alert and oriented, in no acute distress. Pulm:  Breathing unlabored. Psy:  Normal mood, congruent affect.  Low back: He is tender to palpation along the left sciatic notch.  Straight leg raise is equivocal.  He still has hypoactive left Achilles DTR, 1+ on the right.  Lower extremity drink is normal.  Imaging: No results found.  Assessment & Plan: 1.  Improving left-sided sciatica -We will try 1 more epidural injection per Dr. Ernestina Patches.  Start physical therapy at [facility]. Relafen as needed.  If he fails to improve with this regimen, could contemplate chiropractic.     Procedures: No procedures performed        PMFS History: Patient Active Problem List   Diagnosis Date Noted  . Sacrococcygeal disorders, not elsewhere classified 03/06/2021  . Radicular pain of sacrum 03/06/2021  . Diabetes 1.5, managed as type  2 ([place]) 03/06/2021  . Back injury 03/06/2021  . Ventricular premature depolarization 01/17/2020  . Chronic kidney disease, stage 3a ([place]) 01/17/2020  . Asthma without status asthmaticus 09/24/2018  . Long term (current) use of insulin ([place]) 10/26/2017  . Varicose veins of lower extremity 05/15/2017  . Acute stress reaction 04/21/2017  . Diabetic renal disease ([place]) 05/02/2015  . Gastro-esophageal reflux disease without esophagitis 12/29/2013  . Benign prostatic hyperplasia without lower urinary tract symptoms 02/02/2012  . ED (erectile dysfunction) of organic origin 10/22/2011  . Essential hypertension 07/09/2011  . Attention deficit hyperactivity disorder 07/09/2011   Past Medical History:  Diagnosis Date  . Hypertension     History reviewed. No pertinent family history.  History reviewed. No pertinent surgical history. Social History   Occupational History  . Not on file  Tobacco Use  . Smoking status: Not on file  . Smokeless tobacco: Not on file  Substance and Sexual Activity  . Alcohol use: Not on file  . Drug use: Not on file  . Sexual activity: Not on file

## 2021-03-22 ENCOUNTER — Inpatient Hospital Stay: Payer: HMO

## 2021-03-22 ENCOUNTER — Inpatient Hospital Stay: Payer: HMO | Attending: Hematology and Oncology | Admitting: Adult Health

## 2021-03-22 ENCOUNTER — Telehealth: Payer: Self-pay | Admitting: Family Medicine

## 2021-03-22 ENCOUNTER — Other Ambulatory Visit: Payer: Self-pay

## 2021-03-22 ENCOUNTER — Other Ambulatory Visit: Payer: Self-pay | Admitting: Family Medicine

## 2021-03-22 VITALS — BP 160/81 | HR 90 | Temp 98.0°F | Resp 16

## 2021-03-22 DIAGNOSIS — D649 Anemia, unspecified: Secondary | ICD-10-CM | POA: Diagnosis not present

## 2021-03-22 DIAGNOSIS — M5442 Lumbago with sciatica, left side: Secondary | ICD-10-CM

## 2021-03-22 DIAGNOSIS — D75839 Thrombocytosis, unspecified: Secondary | ICD-10-CM | POA: Insufficient documentation

## 2021-03-22 DIAGNOSIS — M5440 Lumbago with sciatica, unspecified side: Secondary | ICD-10-CM

## 2021-03-22 DIAGNOSIS — D472 Monoclonal gammopathy: Secondary | ICD-10-CM

## 2021-03-22 DIAGNOSIS — M5430 Sciatica, unspecified side: Secondary | ICD-10-CM | POA: Diagnosis not present

## 2021-03-22 DIAGNOSIS — C9000 Multiple myeloma not having achieved remission: Secondary | ICD-10-CM | POA: Diagnosis not present

## 2021-03-22 DIAGNOSIS — Z8041 Family history of malignant neoplasm of ovary: Secondary | ICD-10-CM | POA: Diagnosis not present

## 2021-03-22 DIAGNOSIS — D6489 Other specified anemias: Secondary | ICD-10-CM | POA: Diagnosis not present

## 2021-03-22 DIAGNOSIS — R7989 Other specified abnormal findings of blood chemistry: Secondary | ICD-10-CM | POA: Diagnosis not present

## 2021-03-22 LAB — C-REACTIVE PROTEIN: CRP: 0.6 mg/dL (ref <1.0)

## 2021-03-22 LAB — CBC WITH DIFFERENTIAL/PLATELET
Abs Immature Granulocytes: 0.03 10*3/uL (ref 0.00–0.07)
Basophils Absolute: 0.1 10*3/uL (ref 0.0–0.1)
Basophils Relative: 1 %
Eosinophils Absolute: 0.1 10*3/uL (ref 0.0–0.5)
Eosinophils Relative: 1 %
HCT: 33 % — ABNORMAL LOW (ref 39.0–52.0)
Hemoglobin: 11.1 g/dL — ABNORMAL LOW (ref 13.0–17.0)
Immature Granulocytes: 0 %
Lymphocytes Relative: 16 %
Lymphs Abs: 1.2 10*3/uL (ref 0.7–4.0)
MCH: 28.2 pg (ref 26.0–34.0)
MCHC: 33.6 g/dL (ref 30.0–36.0)
MCV: 84 fL (ref 80.0–100.0)
Monocytes Absolute: 0.6 10*3/uL (ref 0.1–1.0)
Monocytes Relative: 8 %
Neutro Abs: 5.5 10*3/uL (ref 1.7–7.7)
Neutrophils Relative %: 74 %
Platelets: 423 10*3/uL — ABNORMAL HIGH (ref 150–400)
RBC: 3.93 MIL/uL — ABNORMAL LOW (ref 4.22–5.81)
RDW: 16.1 % — ABNORMAL HIGH (ref 11.5–15.5)
WBC: 7.5 10*3/uL (ref 4.0–10.5)
nRBC: 0 % (ref 0.0–0.2)

## 2021-03-22 LAB — LACTATE DEHYDROGENASE: LDH: 188 U/L (ref 98–192)

## 2021-03-22 NOTE — Telephone Encounter (Signed)
I called and advised Webb Silversmith. One of Dr. Romona Curls schedulers will be contacting them.

## 2021-03-22 NOTE — Patient Instructions (Signed)
Bone Marrow Aspiration and Bone Marrow Biopsy, Adult, Care After This sheet gives you information about how to care for yourself after your procedure. Your health care provider may also give you more specific instructions. If you have problems or questions, contact your health careprovider. What can I expect after the procedure? After the procedure, it is common to have: Mild pain and tenderness. Swelling. Bruising. Follow these instructions at home: Puncture site care  Follow instructions from your health care provider about how to take care of the puncture site. Make sure you: Wash your hands with soap and water before and after you change your bandage (dressing). If soap and water are not available, use hand sanitizer. Change your dressing as told by your health care provider. Check your puncture site every day for signs of infection. Check for: More redness, swelling, or pain. Fluid or blood. Warmth. Pus or a bad smell.  Activity Return to your normal activities as told by your health care provider. Ask your health care provider what activities are safe for you. Do not lift anything that is heavier than 10 lb (4.5 kg), or the limit that you are told, until your health care provider says that it is safe. Do not drive for 24 hours if you were given a sedative during your procedure. General instructions  Take over-the-counter and prescription medicines only as told by your health care provider. Do not take baths, swim, or use a hot tub until your health care provider approves. Ask your health care provider if you may take showers. You may only be allowed to take sponge baths. If directed, put ice on the affected area. To do this: Put ice in a plastic bag. Place a towel between your skin and the bag. Leave the ice on for 20 minutes, 2-3 times a day. Keep all follow-up visits as told by your health care provider. This is important.  Contact a health care provider if: Your pain is not  controlled with medicine. You have a fever. You have more redness, swelling, or pain around the puncture site. You have fluid or blood coming from the puncture site. Your puncture site feels warm to the touch. You have pus or a bad smell coming from the puncture site. Summary After the procedure, it is common to have mild pain, tenderness, swelling, and bruising. Follow instructions from your health care provider about how to take care of the puncture site and what activities are safe for you. Take over-the-counter and prescription medicines only as told by your health care provider. Contact a health care provider if you have any signs of infection, such as fluid or blood coming from the puncture site. This information is not intended to replace advice given to you by your health care provider. Make sure you discuss any questions you have with your healthcare provider. Document Revised: 02/15/2019 Document Reviewed: 02/15/2019 Elsevier Patient Education  2022 Elsevier Inc.  

## 2021-03-22 NOTE — Progress Notes (Signed)
INDICATION: MGUS  Brief examination was performed. ENT: adequate airway clearance Heart: regular rate and rhythm.No Murmurs Lungs: clear to auscultation, no wheezes, normal respiratory effort  Bone Marrow Biopsy and Aspiration Procedure Note   Informed consent was obtained and potential risks including bleeding, infection and pain were reviewed with the patient.  The patient's name, date of birth, identification, consent and allergies were verified prior to the start of procedure and time out was performed.  The left posterior iliac crest was chosen as the site of biopsy.  The skin was prepped with ChloraPrep.   15 cc of 2% lidocaine was used to provide local anaesthesia.   10 cc of bone marrow aspirate was obtained followed by 1cm biopsy.  Pressure was applied to the biopsy site and bandage was placed over the biopsy site. Patient was made to lie on the back for 30 mins prior to discharge.  The procedure was tolerated well. COMPLICATIONS: None BLOOD LOSS: none The patient was discharged home in stable condition with a 1 week follow up to review results.  Patient was provided with post bone marrow biopsy instructions and instructed to call if there was any bleeding or worsening pain.  Specimens sent for flow cytometry, cytogenetics and additional studies.  Signed Scot Dock, NP

## 2021-03-22 NOTE — Telephone Encounter (Signed)
Patient's wife  Webb Silversmith called concerning patient being referred to Dr Ernestina Patches for an injection in his back Webb Silversmith asked for a call back as soon as possible.  The number to contact Webb Silversmith is [phone]

## 2021-03-22 NOTE — Progress Notes (Signed)
Pt. tolerated Bone Marrow biopsy well. Dressing remains clean,dry, and intact, no bleeding noted. Discharge instructions given an reviewed with pt. Pt. Left via ambulation with RN assistance, no respiratory distress noted.

## 2021-03-22 NOTE — Telephone Encounter (Signed)
I called Allen Hartman: the patient had his bone marrow biopsy this morning. He is due to start PT at the end of next week. When should he have the ESI with Dr. Ernestina Patches? Should he wait 3-4 weeks, or try to have one sooner? The ESIs did help him in the past. With each one he felt 20 - 25% better, being able to be more active. After the 2nd one, he was able to be even more active and come off the opioids.

## 2021-03-23 LAB — BETA 2 MICROGLOBULIN, SERUM: Beta-2 Microglobulin: 2.5 mg/L — ABNORMAL HIGH (ref 0.6–2.4)

## 2021-03-26 ENCOUNTER — Telehealth: Payer: Self-pay | Admitting: Hematology and Oncology

## 2021-03-26 NOTE — Telephone Encounter (Signed)
Scheduled appointment per 06/14 sch msg. Patient will receive updated calender.

## 2021-03-27 ENCOUNTER — Encounter: Payer: HMO | Admitting: Genetic Counselor

## 2021-03-27 ENCOUNTER — Other Ambulatory Visit: Payer: HMO

## 2021-03-28 ENCOUNTER — Telehealth: Payer: Self-pay | Admitting: Physical Medicine and Rehabilitation

## 2021-03-28 ENCOUNTER — Encounter (HOSPITAL_COMMUNITY): Payer: Self-pay | Admitting: Hematology and Oncology

## 2021-03-28 ENCOUNTER — Telehealth: Payer: Self-pay | Admitting: Hematology and Oncology

## 2021-03-28 NOTE — Telephone Encounter (Signed)
Pt called and has a conflict with his appt. He needs someone to call him at [phone]

## 2021-03-28 NOTE — Telephone Encounter (Signed)
Scheduled  appointment per 06/16 sch msg. Patient is aware. 

## 2021-04-01 ENCOUNTER — Encounter (HOSPITAL_COMMUNITY): Payer: Self-pay | Admitting: Hematology and Oncology

## 2021-04-03 ENCOUNTER — Ambulatory Visit: Payer: HMO | Admitting: Physical Medicine and Rehabilitation

## 2021-04-04 ENCOUNTER — Other Ambulatory Visit: Payer: Self-pay

## 2021-04-04 ENCOUNTER — Encounter: Payer: Self-pay | Admitting: Hematology and Oncology

## 2021-04-04 ENCOUNTER — Inpatient Hospital Stay (HOSPITAL_BASED_OUTPATIENT_CLINIC_OR_DEPARTMENT_OTHER): Payer: HMO | Admitting: Hematology and Oncology

## 2021-04-04 VITALS — BP 138/87 | HR 77 | Temp 98.6°F | Resp 18 | Wt 135.7 lb

## 2021-04-04 DIAGNOSIS — C9000 Multiple myeloma not having achieved remission: Secondary | ICD-10-CM | POA: Diagnosis not present

## 2021-04-04 DIAGNOSIS — D472 Monoclonal gammopathy: Secondary | ICD-10-CM | POA: Diagnosis not present

## 2021-04-04 LAB — SURGICAL PATHOLOGY

## 2021-04-04 NOTE — Progress Notes (Signed)
[facility] CONSULT NOTE  Patient Care Team: Prince Solian, MD as PCP - General (Internal Medicine)  CHIEF COMPLAINTS/PURPOSE OF CONSULTATION:  Suspicion for cancer, follow up after additional investigation.  ASSESSMENT & PLAN:   No problem-specific Assessment & Plan notes found for this encounter.  No orders of the defined types were placed in this encounter.  This is a very pleasant 75 yr old male patient with PMH significant for HTN referred to hematology for evaluation of acute low back pain, indeterminate lesion in L4 MRI lumbar spine and some weight loss, referred to oncology for further recommendations.  1. MGUS, Ig G Lambda subtype, abnormal K/L ratio. Mildly elevated creatinine. Hb mildly low. Given abnormal K/L ratio, I recommended proceeding with BMB. BMB showed 10-15% plasma cell by IHC.  I have reviewed his CBC and BMP for the last 3 yrs at least, his average Hb is 12 gms/dl and creatinine is near baseline ( his creatinine has been around 1.3) NO evidence of bone lesions on bone survey. At this time there appears to be no evidence of end organ damage from MM. His only site of pain has been evaluated by MRI which didn't show evidence of any lytic lesions, L4 indeterminate lesion was thought to be a hemangioma according to my discussion with radiology. At this time, he doesn't appear to have any definitive evidence of MM. We will continue to monitor.  2. Family history of ovarian cancer.Recommended genetic testing once acute episode of back pain improves Referral has been made, he cancelled the genetic counseling appointment, I encouraged him to consider it.  3. Low back pain likely from sciatica, improved remarkably compared to previous visit. This has been intermittently bothering him. Neurosurgery plans another epidural for pain control or surgical evaluation.  RTC in 6 months for FU with repeat labs.  HISTORY OF PRESENTING ILLNESS:   Allen Hartman. 74 y.o. male is here because of suspicion for cancer and an indeterminate lesion found on most recent MRI without contrast of his lumbar spine.  Interval History  Since last visit, back pain continues to be an intermittent issue. He has been very active and busy in the past couple days, so he continues to have some issue with pain today. He says they will be doing another epidural soon vs refer him for surgical evaluation. No other concerns. Weight has been stable. He is walking about 1.5 miles a day. Rest of the pertinent 10 point ROS reviewed and neg.  MEDICAL HISTORY:  Past Medical History:  Diagnosis Date   Hypertension     SURGICAL HISTORY: No past surgical history on file.  SOCIAL HISTORY: Social History   Socioeconomic History   Marital status: Married    Spouse name: Not on file   Number of children: Not on file   Years of education: Not on file   Highest education level: Not on file  Occupational History   Not on file  Tobacco Use   Smoking status: Not on file   Smokeless tobacco: Not on file  Substance and Sexual Activity   Alcohol use: Not on file   Drug use: Not on file   Sexual activity: Not on file  Other Topics Concern   Not on file  Social History Narrative   Not on file   Social Determinants of Health   Financial Resource Strain: Not on file  Food Insecurity: Not on file  Transportation Needs: Not on file  Physical Activity: Not on  file  Stress: Not on file  Social Connections: Not on file  Intimate Partner Violence: Not on file    FAMILY HISTORY: No family history on file.  ALLERGIES:  is allergic to sulfa antibiotics.  MEDICATIONS:  Current Outpatient Medications  Medication Sig Dispense Refill   baclofen (LIORESAL) 10 MG tablet Take 0.5-1 tablets (5-10 mg total) by mouth 3 (three) times daily as needed for muscle spasms. 30 each 3   cetirizine (ZYRTEC) 10 MG tablet      CONCERTA 27 MG CR tablet Take 27 mg by mouth every  morning.     Diclofenac Sodium 3 % GEL Apply topically.     gabapentin (NEURONTIN) 300 MG capsule Take 1 capsule (300 mg total) by mouth 3 (three) times daily. 30 capsule 0   insulin glargine (LANTUS SOLOSTAR) 100 UNIT/ML Solostar Pen INJECT 5-7 UNITS [place] Q EVENING BEFORE BED     irbesartan (AVAPRO) 300 MG tablet Take 300 mg by mouth daily.     KOMBIGLYZE XR 2.02-999 MG TB24 Take 1 tablet by mouth 2 (two) times daily.     nabumetone (RELAFEN) 500 MG tablet Take 1 tablet (500 mg total) by mouth 2 (two) times daily as needed. 60 tablet 3   ondansetron (ZOFRAN-ODT) 8 MG disintegrating tablet Take 8 mg by mouth every 8 (eight) hours as needed.     ONETOUCH VERIO test strip 3 (three) times daily.     pantoprazole (PROTONIX) 40 MG tablet Take 1 tablet by mouth 2 (two) times daily.     tamsulosin (FLOMAX) 0.4 MG CAPS capsule SMARTSIG:1 Capsule(s) By Mouth Every Evening     No current facility-administered medications for this visit.     PHYSICAL EXAMINATION:  ECOG PERFORMANCE STATUS: 2 - Symptomatic, <50% confined to bed  Vitals:   04/04/21 1305  BP: 138/87  Pulse: 77  Resp: 18  Temp: 98.6 F (37 C)  SpO2: 100%   Filed Weights   04/04/21 1305  Weight: 135 lb 11.2 oz (61.6 kg)  . PE deferred in lieu of counseling  LABORATORY DATA:  I have reviewed the data as listed Lab Results  Component Value Date   WBC 7.5 03/22/2021   HGB 11.1 (L) 03/22/2021   HCT 33.0 (L) 03/22/2021   MCV 84.0 03/22/2021   PLT 423 (H) 03/22/2021     Chemistry      Component Value Date/Time   NA 136 02/11/2021 1210   K 4.9 02/11/2021 1210   CL 93 (L) 02/11/2021 1210   CO2 31 02/11/2021 1210   BUN 35 (H) 02/11/2021 1210   CREATININE 1.41 (H) 02/11/2021 1210      Component Value Date/Time   CALCIUM 10.4 (H) 02/11/2021 1210   ALKPHOS 70 02/11/2021 1210   AST 15 02/11/2021 1210   ALT 10 02/11/2021 1210   BILITOT 0.4 02/11/2021 1210       RADIOGRAPHIC STUDIES: I have personally reviewed the  radiological images as listed and agreed with the findings in the report. Epidural Steroid injection  Result Date: 03/05/2021 Magnus Sinning, MD     03/06/2021  5:19 AM S1 Lumbosacral Transforaminal Epidural Steroid Injection - Sub-Pedicular Approach with Fluoroscopic Guidance Patient: Allen Hartman.     Date of Birth: Dec 21, 1945 MRN: [id number] PCP: Prince Solian, MD     Visit Date: 03/05/2021  Universal Protocol:   Date/Time: 05/25/225:18 AM Consent Given By: the patient Position:  PRONE Additional Comments: Vital signs were monitored before and after the procedure. Patient was  prepped and draped in the usual sterile fashion. The correct patient, procedure, and site was verified. Injection Procedure Details: Procedure Site One Meds Administered: Meds ordered this encounter Medications  methylPREDNISolone acetate (DEPO-MEDROL) injection 80 mg Laterality: Left Location/Site: S1 Foramen Needle size: 22 ga. Needle type: Spinal Needle Placement: Transforaminal Findings:  -Comments: Excellent flow of contrast along the nerve, nerve root and into the epidural space. Epidurogram: Contrast epidurogram showed no nerve root cut off or restricted flow pattern. Procedure Details: After squaring off the sacral end-plate to get a true AP view, the C-arm was positioned so that the best possible view of the S1 foramen was visualized. The soft tissues overlying this structure were infiltrated with 2-3 ml. of 1% Lidocaine without Epinephrine. The spinal needle was inserted toward the target using a "trajectory" view along the fluoroscope beam.  Under AP and lateral visualization, the needle was advanced so it did not puncture dura. Biplanar projections were used to confirm position. Aspiration was confirmed to be negative for CSF and/or blood. A 1-2 ml. volume of Isovue-250 was injected and flow of contrast was noted at each level. Radiographs were obtained for documentation purposes. After attaining the desired flow of  contrast documented above, a 0.5 to 1.0 ml test dose of 0.25% Marcaine was injected into each respective transforaminal space.  The patient was observed for 90 seconds post injection.  After no sensory deficits were reported, and normal lower extremity motor function was noted,   the above injectate was administered so that equal amounts of the injectate were placed at each foramen (level) into the transforaminal epidural space. Additional Comments: The patient tolerated the procedure well Dressing: Band-Aid with 2 x 2 sterile gauze  Post-procedure details: Patient was observed during the procedure. Post-procedure instructions were reviewed. Patient left the clinic in stable condition.   XR C-ARM NO REPORT  Result Date: 03/05/2021 Please see Notes tab for imaging impression.   03/04/2021  IMPRESSION: No focal lytic lesions identified. Multifocal degenerative changes as described above.   02/18/2021  IMPRESSION: Stable indeterminate L4 vertebral body lesion compared to recent prior study.   Postoperative changes at L5-S1 with scar tissue along the descending left S1 nerve root.   According to my discussion with radiology, he felt this is likely a hemangioma.  Labs review  Anemia, Hb of 12.6, plt 497 SPEP, M spike of 0.6 g/dl, Ig G lambda Creatinine of 1.4, calcium of 10.4, BUN of 35 K/L ratio of 0.2 PSA normal.  BMB reports reviewed  DIAGNOSIS:   BONE MARROW, ASPIRATE, CLOT, CORE:  - Plasma cell myeloma, see comment.  - Minimal iron stores.   PERIPHERAL BLOOD:  - Normocytic anemia.  - Mild thrombocytosis.   COMMENT:   The bone marrow is normocellular but exhibits increased monoclonal  plasma cells (10% aspirate, 15-20% CD138 immunohistochemistry). These  findings are consistent with plasma cell myeloma.   MICROSCOPIC DESCRIPTION:   PERIPHERAL BLOOD SMEAR: There is a normocytic anemia with occasional  elliptocytes.  Rouleaux formation is not identified.  Leukocytes are   present in normal numbers with unremarkable morphology.  Circulating  plasma cells are not identified.  Platelets are mildly increased in  numbers.   BONE MARROW ASPIRATE: Spicular and cellular.  Erythroid precursors: Present in appropriate proportions.  No  significant dysplasia.  Granulocytic precursors: Present in appropriate proportions.  No  significant dysplasia.  No increase in blasts.  Megakaryocytes: Present with a few hypolobated forms.  Lymphocytes/plasma cells: Plasma cells are mildly increased (10%  by  manual differential counts) with a few large forms.  Lymphocytes are not  significantly increased.  FISH showed t (11:14 ) translocation. Cytogenetics normal.  All questions were answered. The patient knows to call the clinic with any problems, questions or concerns.  I spent 30 minutes in the care of this patient including history, reviewing diagnostic criteria for myeloma, surveillance recommendations.    Benay Pike, MD 04/04/2021 1:10 PM

## 2021-04-09 ENCOUNTER — Ambulatory Visit (INDEPENDENT_AMBULATORY_CARE_PROVIDER_SITE_OTHER): Payer: HMO | Admitting: Physical Medicine and Rehabilitation

## 2021-04-09 ENCOUNTER — Other Ambulatory Visit: Payer: Self-pay

## 2021-04-09 ENCOUNTER — Ambulatory Visit: Payer: Self-pay

## 2021-04-09 ENCOUNTER — Encounter: Payer: Self-pay | Admitting: Genetic Counselor

## 2021-04-09 VITALS — BP 118/71 | HR 75

## 2021-04-09 DIAGNOSIS — M5416 Radiculopathy, lumbar region: Secondary | ICD-10-CM | POA: Diagnosis not present

## 2021-04-09 DIAGNOSIS — M961 Postlaminectomy syndrome, not elsewhere classified: Secondary | ICD-10-CM

## 2021-04-09 MED ORDER — METHYLPREDNISOLONE ACETATE 80 MG/ML IJ SUSP
80.0000 mg | Freq: Once | INTRAMUSCULAR | Status: AC
  Administered 2021-04-09: 80 mg

## 2021-04-09 NOTE — Patient Instructions (Signed)

## 2021-04-09 NOTE — Procedures (Signed)
S1 Lumbosacral Transforaminal Epidural Steroid Injection - Sub-Pedicular Approach with Fluoroscopic Guidance   Patient: Allen Hartman.      Date of Birth: 16-May-1946 MRN: [id number] PCP: Prince Solian, MD      Visit Date: 04/09/2021   Universal Protocol:    Date/Time: 06/28/222:12 PM  Consent Given By: the patient  Position:  PRONE  Additional Comments: Vital signs were monitored before and after the procedure. Patient was prepped and draped in the usual sterile fashion. The correct patient, procedure, and site was verified.   Injection Procedure Details:  Procedure Site One Meds Administered:  Meds ordered this encounter  Medications   methylPREDNISolone acetate (DEPO-MEDROL) injection 80 mg    Laterality: Left  Location/Site:  S1 Foramen   Needle size: 22 ga.  Needle type: Spinal  Needle Placement: Transforaminal  Findings:   -Comments: Excellent flow of contrast along the nerve, nerve root and into the epidural space.  Epidurogram: Contrast epidurogram showed no nerve root cut off or restricted flow pattern.  Procedure Details: After squaring off the sacral end-plate to get a true AP view, the C-arm was positioned so that the best possible view of the S1 foramen was visualized. The soft tissues overlying this structure were infiltrated with 2-3 ml. of 1% Lidocaine without Epinephrine.    The spinal needle was inserted toward the target using a "trajectory" view along the fluoroscope beam.  Under AP and lateral visualization, the needle was advanced so it did not puncture dura. Biplanar projections were used to confirm position. Aspiration was confirmed to be negative for CSF and/or blood. A 1-2 ml. volume of Isovue-250 was injected and flow of contrast was noted at each level. Radiographs were obtained for documentation purposes.   After attaining the desired flow of contrast documented above, a 0.5 to 1.0 ml test dose of 0.25% Marcaine was injected into  each respective transforaminal space.  The patient was observed for 90 seconds post injection.  After no sensory deficits were reported, and normal lower extremity motor function was noted,   the above injectate was administered so that equal amounts of the injectate were placed at each foramen (level) into the transforaminal epidural space.   Additional Comments:  No complications occurred Dressing: Band-Aid with 2 x 2 sterile gauze    Post-procedure details: Patient was observed during the procedure. Post-procedure instructions were reviewed.  Patient left the clinic in stable condition.

## 2021-04-09 NOTE — Progress Notes (Signed)
Allen Hartman. - 75 y.o. male MRN [id number]  Date of birth: 10/22/1945  Office Visit Note: Visit Date: 04/09/2021 PCP: Prince Solian, MD Referred by: Prince Solian, MD  Subjective: Chief Complaint  Patient presents with   Lower Back - Pain   Left Foot - Pain   Left Leg - Pain   HPI:  Allen Beamer. is a 75 y.o. male who comes in today Repeat left S1 transforaminal epidural steroid injection.  He is followed by Dr. Eunice Blase.  He reports probably 60% relief overall with 2 prior epidural injections.  He still has quite a bit of pain particularly in the morning and that is pretty problematic for him.  His symptoms are still classic left S1 dermatome symptoms.  He has had prior lumbar laminectomy discectomy at L5-S1 with what appears to be scar tissue in that area with nerve root irritation.  He continues with medication management as well as exercises.  He got more relief with the S1 injection than the prior injection which was performed prior to the MRI findings.  Depending on relief with the injection today could consider 1 other injection at that level.  Intermittent injection treatment if he gets enough relief and this is lasting returns obviously this is something we could do.  Other avenues of pain relief would be regrouping with a spine surgeon for evaluation versus potential to see someone particularly at the [facility] and see if they can do a Racz procedure which is somewhat fallen out of favor but if anybody does it they may continue to do that.  Consideration at some point could be looked at for spinal cord stimulator.  ROS Otherwise per HPI.  Assessment & Plan: Visit Diagnoses:    ICD-10-CM   1. Lumbar radiculopathy  M54.16 XR C-ARM NO REPORT    Epidural Steroid injection    methylPREDNISolone acetate (DEPO-MEDROL) injection 80 mg    2. Post laminectomy syndrome  M96.1 XR C-ARM NO REPORT    Epidural Steroid injection    methylPREDNISolone  acetate (DEPO-MEDROL) injection 80 mg      Plan: No additional findings.   Meds & Orders:  Meds ordered this encounter  Medications   methylPREDNISolone acetate (DEPO-MEDROL) injection 80 mg    Orders Placed This Encounter  Procedures   XR C-ARM NO REPORT   Epidural Steroid injection    Follow-up: Return for visit to requesting physician as needed.   Procedures: No procedures performed  S1 Lumbosacral Transforaminal Epidural Steroid Injection - Sub-Pedicular Approach with Fluoroscopic Guidance   Patient: Allen Hartman.      Date of Birth: [birth year] MRN: [id number] PCP: Prince Solian, MD      Visit Date: 04/09/2021   Universal Protocol:    Date/Time: 06/28/222:12 PM  Consent Given By: the patient  Position:  PRONE  Additional Comments: Vital signs were monitored before and after the procedure. Patient was prepped and draped in the usual sterile fashion. The correct patient, procedure, and site was verified.   Injection Procedure Details:  Procedure Site One Meds Administered:  Meds ordered this encounter  Medications   methylPREDNISolone acetate (DEPO-MEDROL) injection 80 mg    Laterality: Left  Location/Site:  S1 Foramen   Needle size: 22 ga.  Needle type: Spinal  Needle Placement: Transforaminal  Findings:   -Comments: Excellent flow of contrast along the nerve, nerve root and into the epidural space.  Epidurogram: Contrast epidurogram showed no nerve root cut off or  restricted flow pattern.  Procedure Details: After squaring off the sacral end-plate to get a true AP view, the C-arm was positioned so that the best possible view of the S1 foramen was visualized. The soft tissues overlying this structure were infiltrated with 2-3 ml. of 1% Lidocaine without Epinephrine.    The spinal needle was inserted toward the target using a "trajectory" view along the fluoroscope beam.  Under AP and lateral visualization, the needle was advanced so it  did not puncture dura. Biplanar projections were used to confirm position. Aspiration was confirmed to be negative for CSF and/or blood. A 1-2 ml. volume of Isovue-250 was injected and flow of contrast was noted at each level. Radiographs were obtained for documentation purposes.   After attaining the desired flow of contrast documented above, a 0.5 to 1.0 ml test dose of 0.25% Marcaine was injected into each respective transforaminal space.  The patient was observed for 90 seconds post injection.  After no sensory deficits were reported, and normal lower extremity motor function was noted,   the above injectate was administered so that equal amounts of the injectate were placed at each foramen (level) into the transforaminal epidural space.   Additional Comments:  No complications occurred Dressing: Band-Aid with 2 x 2 sterile gauze    Post-procedure details: Patient was observed during the procedure. Post-procedure instructions were reviewed.  Patient left the clinic in stable condition.   Clinical History: MRI LUMBAR SPINE WITHOUT CONTRAST   TECHNIQUE: Multiplanar, multisequence MR imaging of the lumbar spine was performed. No intravenous contrast was administered.   COMPARISON:  MRI lumbar spine 06/06/2010   FINDINGS: Segmentation: 5 non rib-bearing lumbar type vertebral bodies are present. The lowest fully formed vertebral body is L5.   Alignment: No significant listhesis is present. Straightening of the normal lumbar lordosis is similar the prior exam.   Vertebrae: Indeterminate T2 hyperintense lesion is present at L4 measuring up to 13 mm. No other focal lesions are present. Marrow signal and vertebral body heights are otherwise normal.   Conus medullaris and cauda equina: Conus extends to the L1-2 level. Conus and cauda equina appear normal.   Paraspinal and other soft tissues: Limited imaging the abdomen is unremarkable. There is no significant adenopathy. No solid  organ lesions are present. Paraspinous musculature is within normal limits.   Disc levels:   L1-2: Mild broad-based disc bulging without significant stenosis.   L2-3: Broad-based disc bulge noted. Mild facet hypertrophy is present bilaterally. This leads to mild foraminal narrowing bilaterally.   L3-4: A broad-based disc bulge is present. Mild facet hypertrophy is noted bilaterally. Moderate right and mild left foraminal narrowing is present.   L4-5: Shallow right paramedian disc protrusion is healed since the prior study. No significant central canal stenosis is present. Moderate bilateral foraminal stenosis has progressed.   L5-S1: Left laminectomy is present. Central canal is decompressed. The right foramen is patent. Mild left foraminal narrowing persists.   IMPRESSION: 1. Moderate bilateral foraminal stenosis at L4-5 has progressed. This is the most significant level. 2. Left laminectomy at L5-S1 with decompression of the central canal. Mild left foraminal narrowing persists. 3. Moderate right and mild left foraminal stenosis at L3-4 is new. 4. Mild foraminal narrowing bilaterally at L2-3.     Electronically Signed   By: San Morelle M.D.   On: 02/01/2021 17:35     Objective:  VS:  HT:    WT:   BMI:     BP:118/71  HR:75bpm  TEMP: ( )  RESP:  Physical Exam Vitals and nursing note reviewed.  Constitutional:      General: He is not in acute distress.    Appearance: Normal appearance. He is not ill-appearing.  HENT:     Head: Normocephalic and atraumatic.     Right Ear: External ear normal.     Left Ear: External ear normal.     Nose: No congestion.  Eyes:     Extraocular Movements: Extraocular movements intact.  Cardiovascular:     Rate and Rhythm: Normal rate.     Pulses: Normal pulses.  Pulmonary:     Effort: Pulmonary effort is normal. No respiratory distress.  Abdominal:     General: There is no distension.     Palpations: Abdomen is soft.   Musculoskeletal:        General: No tenderness or signs of injury.     Cervical back: Neck supple.     Right lower leg: No edema.     Left lower leg: No edema.     Comments: Patient has good distal strength without clonus.  Skin:    Findings: No erythema or rash.  Neurological:     General: No focal deficit present.     Mental Status: He is alert and oriented to person, place, and time.     Sensory: No sensory deficit.     Motor: No weakness or abnormal muscle tone.     Coordination: Coordination normal.  Psychiatric:        Mood and Affect: Mood normal.        Behavior: Behavior normal.     Imaging: No results found.

## 2021-04-09 NOTE — Progress Notes (Signed)
Pt state lower back pain that travels down his left leg and foot. Pt state climbing stairs and sitting makes the pain worse. Pt state he uses ice and pain meds to help ease his pain. Pt has hx of inj on 03/05/21 pt state it help sum.  Numeric Pain Rating Scale and Functional Assessment Average Pain 6   In the last MONTH (on 0-10 scale) has pain interfered with the following?  1. General activity like being  able to carry out your everyday physical activities such as walking, climbing stairs, carrying groceries, or moving a chair?  Rating(9)   +Driver, -BT, -Dye Allergies.

## 2021-04-10 ENCOUNTER — Encounter: Payer: HMO | Admitting: Genetic Counselor

## 2021-04-12 ENCOUNTER — Other Ambulatory Visit: Payer: Self-pay | Admitting: Hematology and Oncology

## 2021-04-12 NOTE — Progress Notes (Signed)
I called [name] but couldn't leave a voicemail, mailbox full I then called Allen Hartman as well, left a message that his Hb and creatinine numbers appear to be similar when I reviewed the past 2/3 yrs labs, so we will plan to follow up his MGUS. He was encouraged to call us with any questions or worsening concerns.

## 2021-04-24 DIAGNOSIS — M545 Low back pain, unspecified: Secondary | ICD-10-CM | POA: Diagnosis not present

## 2021-05-01 DIAGNOSIS — M545 Low back pain, unspecified: Secondary | ICD-10-CM | POA: Diagnosis not present

## 2021-06-25 DIAGNOSIS — E785 Hyperlipidemia, unspecified: Secondary | ICD-10-CM | POA: Diagnosis not present

## 2021-06-25 DIAGNOSIS — B351 Tinea unguium: Secondary | ICD-10-CM | POA: Diagnosis not present

## 2021-06-25 DIAGNOSIS — I8390 Asymptomatic varicose veins of unspecified lower extremity: Secondary | ICD-10-CM | POA: Diagnosis not present

## 2021-06-25 DIAGNOSIS — M1990 Unspecified osteoarthritis, unspecified site: Secondary | ICD-10-CM | POA: Diagnosis not present

## 2021-06-25 DIAGNOSIS — M5418 Radiculopathy, sacral and sacrococcygeal region: Secondary | ICD-10-CM | POA: Diagnosis not present

## 2021-06-25 DIAGNOSIS — I493 Ventricular premature depolarization: Secondary | ICD-10-CM | POA: Diagnosis not present

## 2021-06-25 DIAGNOSIS — N400 Enlarged prostate without lower urinary tract symptoms: Secondary | ICD-10-CM | POA: Diagnosis not present

## 2021-06-25 DIAGNOSIS — N1831 Chronic kidney disease, stage 3a: Secondary | ICD-10-CM | POA: Diagnosis not present

## 2021-06-25 DIAGNOSIS — G720 Drug-induced myopathy: Secondary | ICD-10-CM | POA: Diagnosis not present

## 2021-06-25 DIAGNOSIS — E1129 Type 2 diabetes mellitus with other diabetic kidney complication: Secondary | ICD-10-CM | POA: Diagnosis not present

## 2021-06-25 DIAGNOSIS — I129 Hypertensive chronic kidney disease with stage 1 through stage 4 chronic kidney disease, or unspecified chronic kidney disease: Secondary | ICD-10-CM | POA: Diagnosis not present

## 2021-09-10 ENCOUNTER — Ambulatory Visit: Payer: HMO | Admitting: Hematology and Oncology

## 2021-09-10 ENCOUNTER — Other Ambulatory Visit: Payer: HMO

## 2021-09-11 ENCOUNTER — Inpatient Hospital Stay: Payer: HMO | Admitting: Hematology and Oncology

## 2021-09-11 ENCOUNTER — Inpatient Hospital Stay: Payer: HMO

## 2021-09-15 IMAGING — DX DG BONE SURVEY MET
9 of 10 series · 9 of 10 positions shown · non-contrast
Comparison: None.

CLINICAL DATA: Myeloma, staging examination

EXAM:
METASTATIC BONE SURVEY

[skull lat]
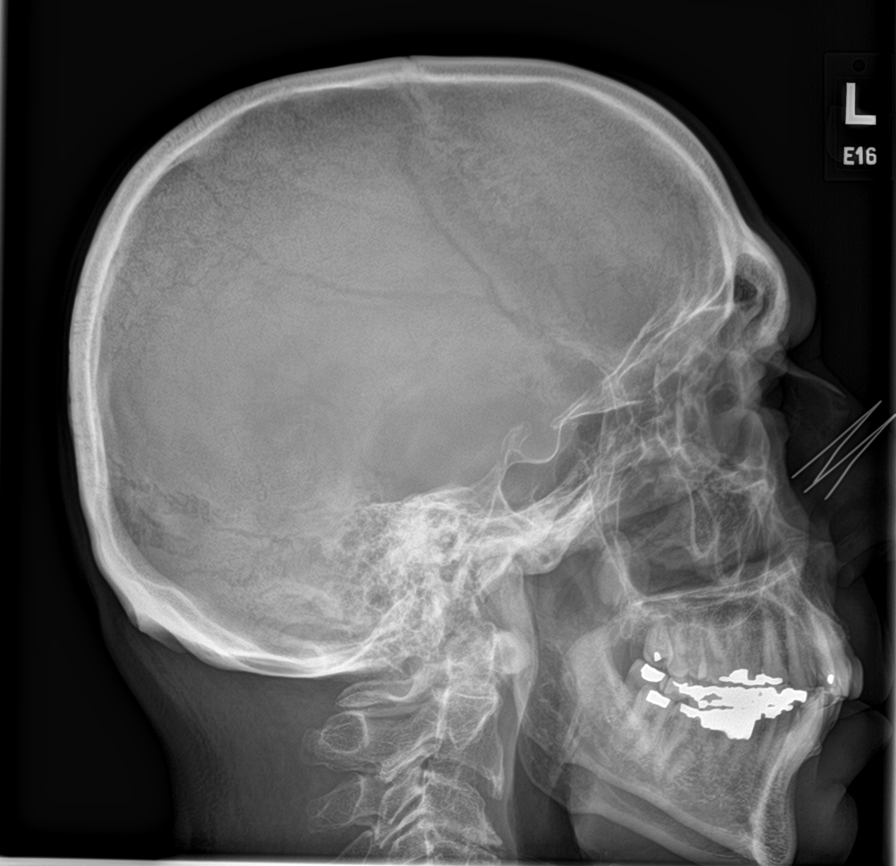

[shoulder ap (1 of 2)]
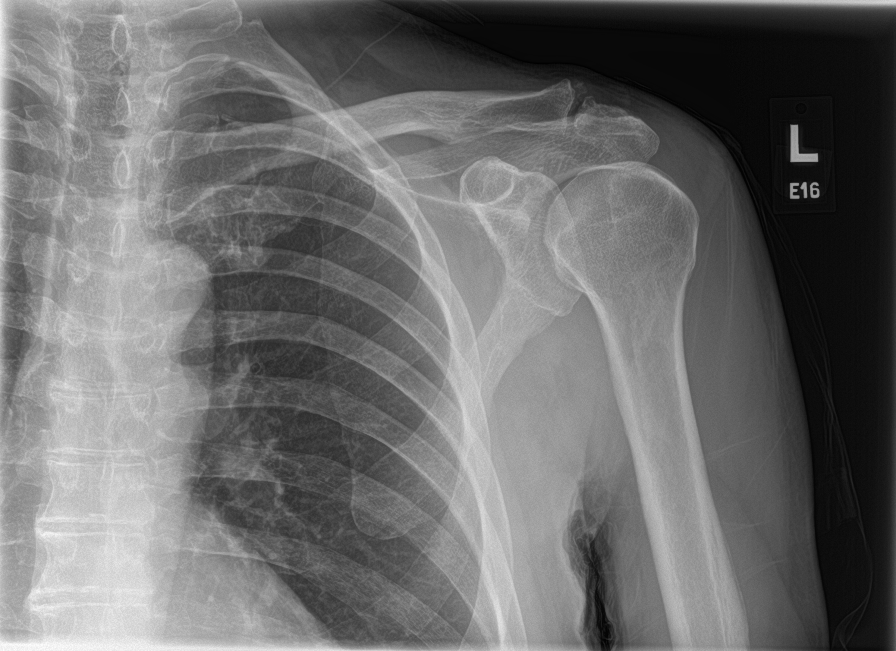

[shoulder ap (2 of 2)]
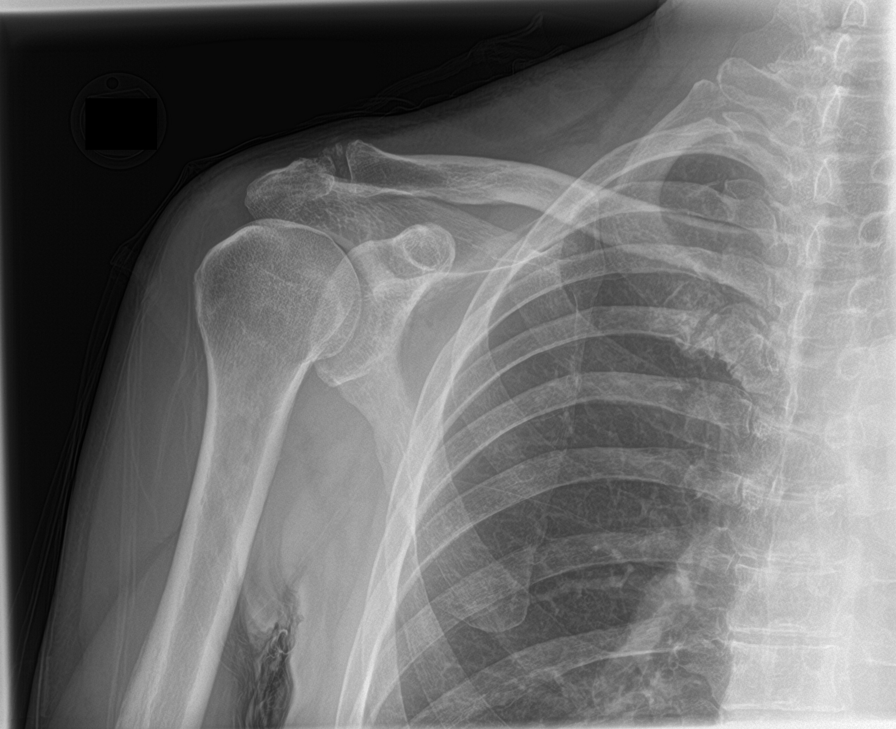

[humerus ap (1 of 2)]
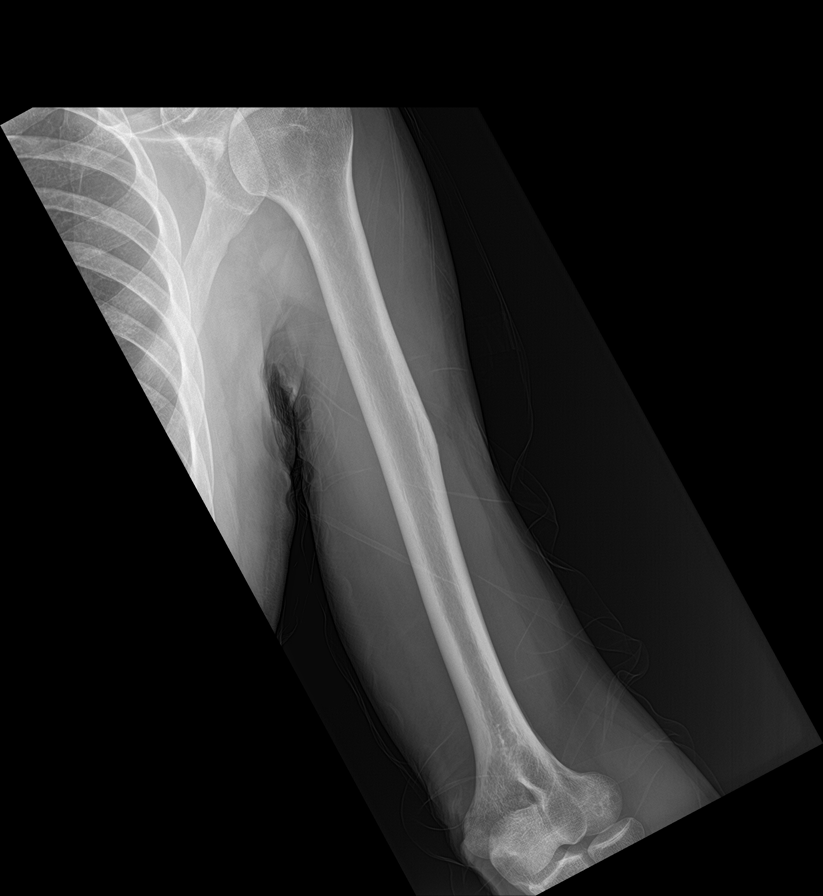

[humerus ap (2 of 2)]
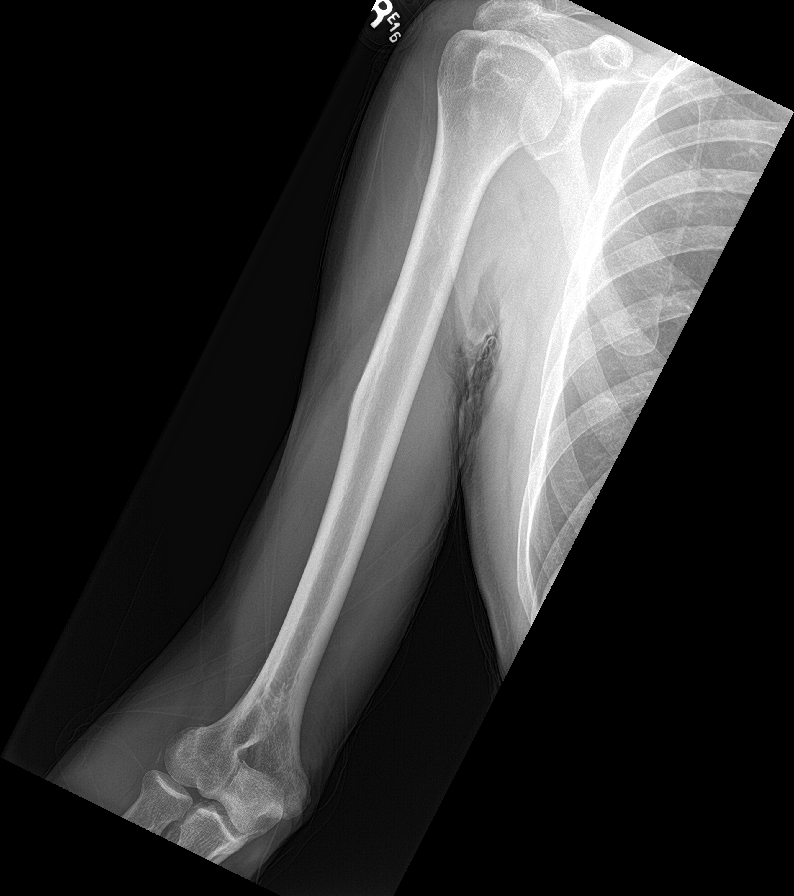

[forearm ap (1 of 2)]
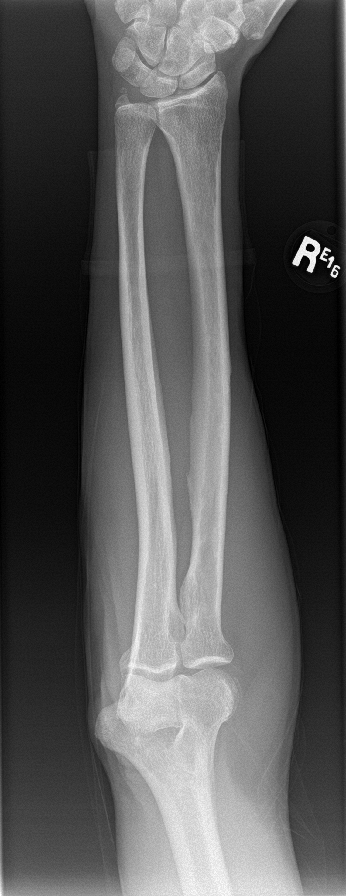

[forearm ap (2 of 2)]
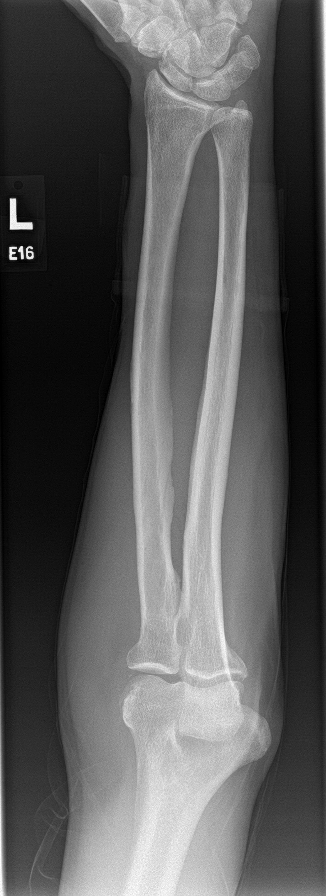

[c-spine ap]
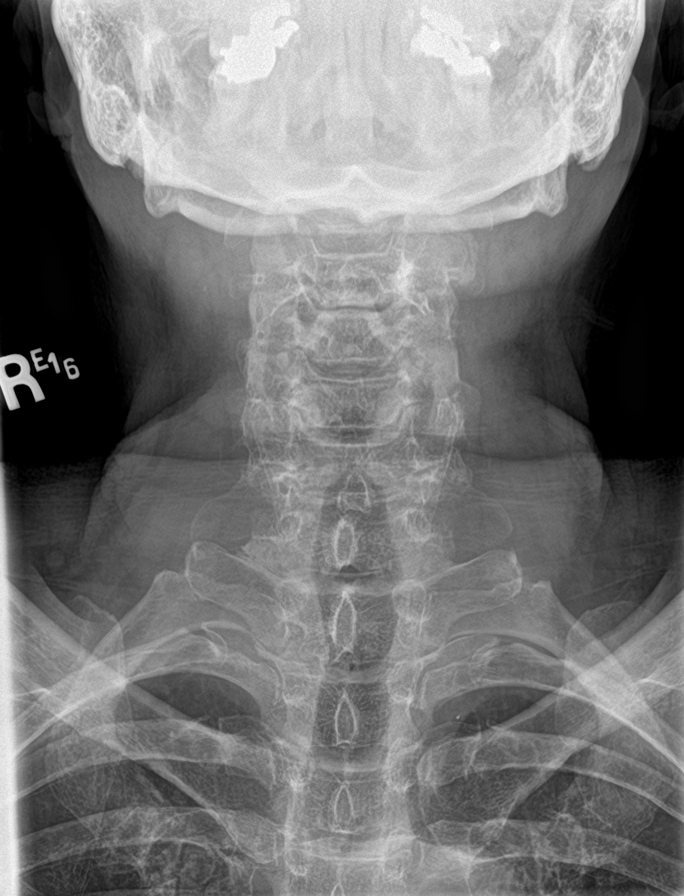

[c-spine lat]
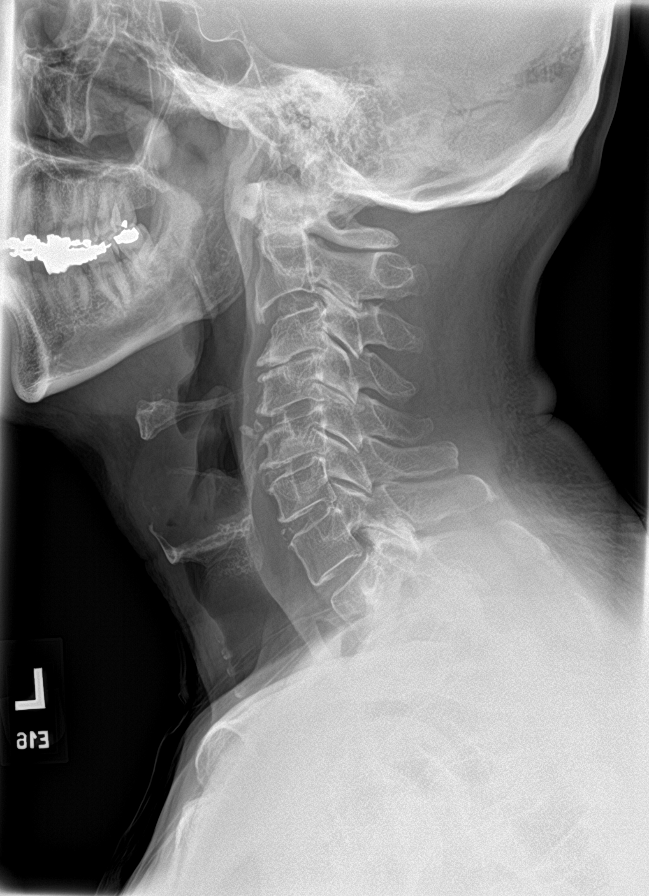

[9 of 10 positions shown; findings below may reference images not displayed]

FINDINGS: No focal lytic lesions are identified within the visualized axial
and appendicular skeleton.

Degenerative changes are noted within the acromioclavicular joints
bilaterally. Degenerative changes are seen within the cervical
spine at C2-C6, most severe at C3-4. Mild degenerative changes are
seen throughout the thoracic spine.
IMPRESSION: No focal lytic lesions identified. Multifocal degenerative changes
as described above.

## 2021-09-19 DIAGNOSIS — E119 Type 2 diabetes mellitus without complications: Secondary | ICD-10-CM | POA: Diagnosis not present

## 2021-10-17 ENCOUNTER — Inpatient Hospital Stay: Payer: HMO

## 2021-10-17 ENCOUNTER — Encounter: Payer: Self-pay | Admitting: Hematology and Oncology

## 2021-10-17 ENCOUNTER — Other Ambulatory Visit: Payer: Self-pay

## 2021-10-17 ENCOUNTER — Inpatient Hospital Stay: Payer: HMO | Attending: Hematology and Oncology

## 2021-10-17 ENCOUNTER — Inpatient Hospital Stay: Payer: HMO | Admitting: Hematology and Oncology

## 2021-10-17 VITALS — BP 142/74 | HR 77 | Temp 98.3°F | Resp 16 | Ht 69.0 in | Wt 146.4 lb

## 2021-10-17 DIAGNOSIS — D472 Monoclonal gammopathy: Secondary | ICD-10-CM

## 2021-10-17 DIAGNOSIS — Z79899 Other long term (current) drug therapy: Secondary | ICD-10-CM | POA: Insufficient documentation

## 2021-10-17 DIAGNOSIS — D649 Anemia, unspecified: Secondary | ICD-10-CM | POA: Diagnosis not present

## 2021-10-17 DIAGNOSIS — C9000 Multiple myeloma not having achieved remission: Secondary | ICD-10-CM | POA: Diagnosis not present

## 2021-10-17 DIAGNOSIS — D75839 Thrombocytosis, unspecified: Secondary | ICD-10-CM | POA: Diagnosis not present

## 2021-10-17 LAB — CBC WITH DIFFERENTIAL/PLATELET
Abs Immature Granulocytes: 0.02 10*3/uL (ref 0.00–0.07)
Basophils Absolute: 0.1 10*3/uL (ref 0.0–0.1)
Basophils Relative: 1 %
Eosinophils Absolute: 0.3 10*3/uL (ref 0.0–0.5)
Eosinophils Relative: 4 %
HCT: 35.3 % — ABNORMAL LOW (ref 39.0–52.0)
Hemoglobin: 11.3 g/dL — ABNORMAL LOW (ref 13.0–17.0)
Immature Granulocytes: 0 %
Lymphocytes Relative: 37 %
Lymphs Abs: 2.7 10*3/uL (ref 0.7–4.0)
MCH: 26.9 pg (ref 26.0–34.0)
MCHC: 32 g/dL (ref 30.0–36.0)
MCV: 84 fL (ref 80.0–100.0)
Monocytes Absolute: 0.7 10*3/uL (ref 0.1–1.0)
Monocytes Relative: 10 %
Neutro Abs: 3.5 10*3/uL (ref 1.7–7.7)
Neutrophils Relative %: 48 %
Platelets: 430 10*3/uL — ABNORMAL HIGH (ref 150–400)
RBC: 4.2 MIL/uL — ABNORMAL LOW (ref 4.22–5.81)
RDW: 16.1 % — ABNORMAL HIGH (ref 11.5–15.5)
WBC: 7.2 10*3/uL (ref 4.0–10.5)
nRBC: 0 % (ref 0.0–0.2)

## 2021-10-17 NOTE — Progress Notes (Signed)
[facility] CONSULT NOTE  Patient Care Team: Prince Solian, MD as PCP - General (Internal Medicine)  CHIEF COMPLAINTS/PURPOSE OF CONSULTATION:  Suspicion for cancer, follow up after additional investigation.  ASSESSMENT & PLAN:   No problem-specific Assessment & Plan notes found for this encounter.   No orders of the defined types were placed in this encounter.   This is a very pleasant 76 yr old male patient with PMH significant for HTN referred to hematology for evaluation of acute low back pain, indeterminate lesion in L4 MRI lumbar spine and some weight loss, referred to oncology for further recommendations.  1. MGUS, Ig G Lambda subtype, abnormal K/L ratio. Mildly elevated creatinine. Hb mildly low. Given abnormal K/L ratio, I recommended proceeding with BMB. BMB showed 10-15% plasma cell by IHC.  I have reviewed his CBC and BMP for the last 3 yrs at least, his average Hb is 12 gms/dl and creatinine is near baseline ( his creatinine has been around 1.3) NO evidence of bone lesions on bone survey. At this time there appears to be no evidence of end organ damage from MM. His only site of pain has been evaluated by MRI which didn't show evidence of any lytic lesions, L4 indeterminate lesion was thought to be a hemangioma according to my discussion with radiology. He is here for follow-up for his MGUS.  No concerning review of systems.  Back pain has significantly improved compared to his last visit.  He is working with physiotherapy and having some dry needling procedures. Physical examination today unremarkable, no palpable lymphadenopathy or hepatosplenomegaly. Will repeat multiple myeloma labs today.  CBC has overall remained stable, hemoglobin is 11.3 g/dL  2. Family history of ovarian cancer. I have referred him to genetic counselor, he canceled the appointment and he is not really interested in scheduling another appointment at this time.  3. Low back pain  likely from sciatica, improved remarkably compared to previous visit. This has been intermittently bothering him. Again he follows up with physiotherapy as mentioned above.  RTC in 6 months for FU with repeat labs.  HISTORY OF PRESENTING ILLNESS:   Allen Hartman. 76 y.o. male is here because of suspicion for cancer and an indeterminate lesion found on most recent MRI without contrast of his lumbar spine.  Interval History  Since last visit, back pain continues to be an intermittent issue. He has improved significantly in terms of back pain.  He continues to work with physiotherapy and had some dry needling on December 26 which has worked remarkably well.  Is hoping to go back to the gym soon and do some mild exercises. No B symptoms.  No new bone pains.  No change in bowel habits.  No hematochezia or melena. No bowel or bladder retention or incontinence issues. Rest of the pertinent 10 point ROS reviewed and negative.  MEDICAL HISTORY:  Past Medical History:  Diagnosis Date   Hypertension     SURGICAL HISTORY: No past surgical history on file.  SOCIAL HISTORY: Social History   Socioeconomic History   Marital status: Married    Spouse name: Not on file   Number of children: Not on file   Years of education: Not on file   Highest education level: Not on file  Occupational History   Not on file  Tobacco Use   Smoking status: Not on file   Smokeless tobacco: Not on file  Substance and Sexual Activity   Alcohol use: Not on file  Drug use: Not on file   Sexual activity: Not on file  Other Topics Concern   Not on file  Social History Narrative   Not on file   Social Determinants of Health   Financial Resource Strain: Not on file  Food Insecurity: Not on file  Transportation Needs: Not on file  Physical Activity: Not on file  Stress: Not on file  Social Connections: Not on file  Intimate Partner Violence: Not on file    FAMILY HISTORY: Family History   Problem Relation Age of Onset   Cancer Sister    Cancer Sister    Cancer Sister     ALLERGIES:  is allergic to sulfa antibiotics.  MEDICATIONS:  Current Outpatient Medications  Medication Sig Dispense Refill   baclofen (LIORESAL) 10 MG tablet Take 0.5-1 tablets (5-10 mg total) by mouth 3 (three) times daily as needed for muscle spasms. 30 each 3   cetirizine (ZYRTEC) 10 MG tablet      CONCERTA 27 MG CR tablet Take 27 mg by mouth every morning.     Diclofenac Sodium 3 % GEL Apply topically.     gabapentin (NEURONTIN) 300 MG capsule Take 1 capsule (300 mg total) by mouth 3 (three) times daily. 30 capsule 0   insulin glargine (LANTUS SOLOSTAR) 100 UNIT/ML Solostar Pen INJECT 5-7 UNITS [place] Q EVENING BEFORE BED     irbesartan (AVAPRO) 300 MG tablet Take 300 mg by mouth daily.     KOMBIGLYZE XR 2.02-999 MG TB24 Take 1 tablet by mouth 2 (two) times daily.     nabumetone (RELAFEN) 500 MG tablet Take 1 tablet (500 mg total) by mouth 2 (two) times daily as needed. 60 tablet 3   ondansetron (ZOFRAN-ODT) 8 MG disintegrating tablet Take 8 mg by mouth every 8 (eight) hours as needed.     ONETOUCH VERIO test strip 3 (three) times daily.     pantoprazole (PROTONIX) 40 MG tablet Take 1 tablet by mouth 2 (two) times daily.     tamsulosin (FLOMAX) 0.4 MG CAPS capsule SMARTSIG:1 Capsule(s) By Mouth Every Evening     No current facility-administered medications for this visit.     PHYSICAL EXAMINATION:  ECOG PERFORMANCE STATUS: 2 - Symptomatic, <50% confined to bed  Vitals:   10/17/21 0925  BP: (!) 142/74  Pulse: 77  Resp: 16  Temp: 98.3 F (36.8 C)  SpO2: 100%   Filed Weights   10/17/21 0925  Weight: 146 lb 6.4 oz (66.4 kg)  . Physical Exam Constitutional:      Appearance: Normal appearance.  HENT:     Head: Normocephalic and atraumatic.  Cardiovascular:     Rate and Rhythm: Normal rate and regular rhythm.  Pulmonary:     Effort: Pulmonary effort is normal.     Breath sounds:  Normal breath sounds.  Abdominal:     General: There is no distension.     Palpations: There is no mass.     Tenderness: There is no abdominal tenderness.  Musculoskeletal:        General: No swelling or tenderness.     Cervical back: Normal range of motion. No rigidity.  Lymphadenopathy:     Cervical: No cervical adenopathy.  Skin:    General: Skin is warm and dry.  Neurological:     Mental Status: He is alert and oriented to person, place, and time.  Psychiatric:        Mood and Affect: Mood normal.     LABORATORY DATA:  I have reviewed the data as listed Lab Results  Component Value Date   WBC 7.2 10/17/2021   HGB 11.3 (L) 10/17/2021   HCT 35.3 (L) 10/17/2021   MCV 84.0 10/17/2021   PLT 430 (H) 10/17/2021     Chemistry      Component Value Date/Time   NA 136 02/11/2021 1210   K 4.9 02/11/2021 1210   CL 93 (L) 02/11/2021 1210   CO2 31 02/11/2021 1210   BUN 35 (H) 02/11/2021 1210   CREATININE 1.41 (H) 02/11/2021 1210      Component Value Date/Time   CALCIUM 10.4 (H) 02/11/2021 1210   ALKPHOS 70 02/11/2021 1210   AST 15 02/11/2021 1210   ALT 10 02/11/2021 1210   BILITOT 0.4 02/11/2021 1210       RADIOGRAPHIC STUDIES: I have personally reviewed the radiological images as listed and agreed with the findings in the report. No results found.  03/04/2021  IMPRESSION: No focal lytic lesions identified. Multifocal degenerative changes as described above.   02/18/2021  IMPRESSION: Stable indeterminate L4 vertebral body lesion compared to recent prior study.   Postoperative changes at L5-S1 with scar tissue along the descending left S1 nerve root.   According to my discussion with radiology, he felt this is likely a hemangioma.  Labs review  Anemia, Hb of 12.6, plt 497 SPEP, M spike of 0.6 g/dl, Ig G lambda Creatinine of 1.4, calcium of 10.4, BUN of 35 K/L ratio of 0.2 PSA normal.  BMB reports reviewed  DIAGNOSIS:   BONE MARROW, ASPIRATE, CLOT,  CORE:  - Plasma cell myeloma, see comment.  - Minimal iron stores.   PERIPHERAL BLOOD:  - Normocytic anemia.  - Mild thrombocytosis.   COMMENT:   The bone marrow is normocellular but exhibits increased monoclonal  plasma cells (10% aspirate, 15-20% CD138 immunohistochemistry). These  findings are consistent with plasma cell myeloma.   MICROSCOPIC DESCRIPTION:   PERIPHERAL BLOOD SMEAR: There is a normocytic anemia with occasional  elliptocytes.  Rouleaux formation is not identified.  Leukocytes are  present in normal numbers with unremarkable morphology.  Circulating  plasma cells are not identified.  Platelets are mildly increased in  numbers.   BONE MARROW ASPIRATE: Spicular and cellular.  Erythroid precursors: Present in appropriate proportions.  No  significant dysplasia.  Granulocytic precursors: Present in appropriate proportions.  No  significant dysplasia.  No increase in blasts.  Megakaryocytes: Present with a few hypolobated forms.  Lymphocytes/plasma cells: Plasma cells are mildly increased (10% by  manual differential counts) with a few large forms.  Lymphocytes are not  significantly increased.  FISH showed t (11:14 ) translocation. Cytogenetics normal.  All questions were answered. The patient knows to call the clinic with any problems, questions or concerns.  Labs from today are pending.  CBC is essentially stable  I spent 20 minutes in the care of this patient including history, reviewing diagnostic criteria for myeloma, surveillance recommendations.    Benay Pike, MD 10/17/2021 9:43 AM

## 2021-10-17 NOTE — Progress Notes (Signed)
[facility] CONSULT NOTE  Patient Care Team: Prince Solian, MD as PCP - General (Internal Medicine)  CHIEF COMPLAINTS/PURPOSE OF CONSULTATION:  Suspicion for cancer, follow up after additional investigation.  ASSESSMENT & PLAN:   No problem-specific Assessment & Plan notes found for this encounter.   No orders of the defined types were placed in this encounter.   This is a very pleasant 76 yr old male patient with PMH significant for HTN referred to hematology for evaluation of acute low back pain, indeterminate lesion in L4 MRI lumbar spine and some weight loss, referred to oncology for further recommendations.  1. MGUS, Ig G Lambda subtype, abnormal K/L ratio. Mildly elevated creatinine. Hb mildly low. Given abnormal K/L ratio, I recommended proceeding with BMB. BMB showed 10-15% plasma cell by IHC.  I have reviewed his CBC and BMP for the last 3 yrs at least, his average Hb is 12 gms/dl and creatinine is near baseline ( his creatinine has been around 1.3) NO evidence of bone lesions on bone survey. At this time there appears to be no evidence of end organ damage from MM. His only site of pain has been evaluated by MRI which didn't show evidence of any lytic lesions, L4 indeterminate lesion was thought to be a hemangioma according to my discussion with radiology. At this time, he doesn't appear to have any definitive evidence of MM. We will continue to monitor.  2. Family history of ovarian cancer.Recommended genetic testing once acute episode of back pain improves Referral has been made, he cancelled the genetic counseling appointment, I encouraged him to consider it.  3. Low back pain likely from sciatica, improved remarkably compared to previous visit. This has been intermittently bothering him. Neurosurgery plans another epidural for pain control or surgical evaluation.  RTC in 6 months for FU with repeat labs.  HISTORY OF PRESENTING ILLNESS:   Allen Hartman. 76 y.o. male is here because of suspicion for cancer and an indeterminate lesion found on most recent MRI without contrast of his lumbar spine.  Interval History  Since last visit, back pain continues to be an intermittent issue. He has been very active and busy in the past couple days, so he continues to have some issue with pain today. He says they will be doing another epidural soon vs refer him for surgical evaluation. No other concerns. Weight has been stable. He is walking about 1.5 miles a day. Rest of the pertinent 10 point ROS reviewed and neg.  MEDICAL HISTORY:  Past Medical History:  Diagnosis Date   Hypertension     SURGICAL HISTORY: No past surgical history on file.  SOCIAL HISTORY: Social History   Socioeconomic History   Marital status: Married    Spouse name: Not on file   Number of children: Not on file   Years of education: Not on file   Highest education level: Not on file  Occupational History   Not on file  Tobacco Use   Smoking status: Not on file   Smokeless tobacco: Not on file  Substance and Sexual Activity   Alcohol use: Not on file   Drug use: Not on file   Sexual activity: Not on file  Other Topics Concern   Not on file  Social History Narrative   Not on file   Social Determinants of Health   Financial Resource Strain: Not on file  Food Insecurity: Not on file  Transportation Needs: Not on file  Physical Activity:  Not on file  Stress: Not on file  Social Connections: Not on file  Intimate Partner Violence: Not on file    FAMILY HISTORY: Family History  Problem Relation Age of Onset   Cancer Sister    Cancer Sister    Cancer Sister     ALLERGIES:  is allergic to sulfa antibiotics.  MEDICATIONS:  Current Outpatient Medications  Medication Sig Dispense Refill   baclofen (LIORESAL) 10 MG tablet Take 0.5-1 tablets (5-10 mg total) by mouth 3 (three) times daily as needed for muscle spasms. 30 each 3   cetirizine  (ZYRTEC) 10 MG tablet      CONCERTA 27 MG CR tablet Take 27 mg by mouth every morning.     Diclofenac Sodium 3 % GEL Apply topically.     gabapentin (NEURONTIN) 300 MG capsule Take 1 capsule (300 mg total) by mouth 3 (three) times daily. 30 capsule 0   insulin glargine (LANTUS SOLOSTAR) 100 UNIT/ML Solostar Pen INJECT 5-7 UNITS [place] Q EVENING BEFORE BED     irbesartan (AVAPRO) 300 MG tablet Take 300 mg by mouth daily.     KOMBIGLYZE XR 2.02-999 MG TB24 Take 1 tablet by mouth 2 (two) times daily.     nabumetone (RELAFEN) 500 MG tablet Take 1 tablet (500 mg total) by mouth 2 (two) times daily as needed. 60 tablet 3   ondansetron (ZOFRAN-ODT) 8 MG disintegrating tablet Take 8 mg by mouth every 8 (eight) hours as needed.     ONETOUCH VERIO test strip 3 (three) times daily.     pantoprazole (PROTONIX) 40 MG tablet Take 1 tablet by mouth 2 (two) times daily.     tamsulosin (FLOMAX) 0.4 MG CAPS capsule SMARTSIG:1 Capsule(s) By Mouth Every Evening     No current facility-administered medications for this visit.     PHYSICAL EXAMINATION:  ECOG PERFORMANCE STATUS: 2 - Symptomatic, <50% confined to bed  Vitals:   10/17/21 0925  BP: (!) 142/74  Pulse: 77  Resp: 16  Temp: 98.3 F (36.8 C)  SpO2: 100%   Filed Weights   10/17/21 0925  Weight: 146 lb 6.4 oz (66.4 kg)  . PE deferred in lieu of counseling  LABORATORY DATA:  I have reviewed the data as listed Lab Results  Component Value Date   WBC 7.2 10/17/2021   HGB 11.3 (L) 10/17/2021   HCT 35.3 (L) 10/17/2021   MCV 84.0 10/17/2021   PLT 430 (H) 10/17/2021     Chemistry      Component Value Date/Time   NA 136 02/11/2021 1210   K 4.9 02/11/2021 1210   CL 93 (L) 02/11/2021 1210   CO2 31 02/11/2021 1210   BUN 35 (H) 02/11/2021 1210   CREATININE 1.41 (H) 02/11/2021 1210      Component Value Date/Time   CALCIUM 10.4 (H) 02/11/2021 1210   ALKPHOS 70 02/11/2021 1210   AST 15 02/11/2021 1210   ALT 10 02/11/2021 1210   BILITOT 0.4  02/11/2021 1210       RADIOGRAPHIC STUDIES: I have personally reviewed the radiological images as listed and agreed with the findings in the report. No results found.  03/04/2021  IMPRESSION: No focal lytic lesions identified. Multifocal degenerative changes as described above.   02/18/2021  IMPRESSION: Stable indeterminate L4 vertebral body lesion compared to recent prior study.   Postoperative changes at L5-S1 with scar tissue along the descending left S1 nerve root.   According to my discussion with radiology, he felt this is  likely a hemangioma.  Labs review  Anemia, Hb of 12.6, plt 497 SPEP, M spike of 0.6 g/dl, Ig G lambda Creatinine of 1.4, calcium of 10.4, BUN of 35 K/L ratio of 0.2 PSA normal.  BMB reports reviewed  DIAGNOSIS:   BONE MARROW, ASPIRATE, CLOT, CORE:  - Plasma cell myeloma, see comment.  - Minimal iron stores.   PERIPHERAL BLOOD:  - Normocytic anemia.  - Mild thrombocytosis.   COMMENT:   The bone marrow is normocellular but exhibits increased monoclonal  plasma cells (10% aspirate, 15-20% CD138 immunohistochemistry). These  findings are consistent with plasma cell myeloma.   MICROSCOPIC DESCRIPTION:   PERIPHERAL BLOOD SMEAR: There is a normocytic anemia with occasional  elliptocytes.  Rouleaux formation is not identified.  Leukocytes are  present in normal numbers with unremarkable morphology.  Circulating  plasma cells are not identified.  Platelets are mildly increased in  numbers.   BONE MARROW ASPIRATE: Spicular and cellular.  Erythroid precursors: Present in appropriate proportions.  No  significant dysplasia.  Granulocytic precursors: Present in appropriate proportions.  No  significant dysplasia.  No increase in blasts.  Megakaryocytes: Present with a few hypolobated forms.  Lymphocytes/plasma cells: Plasma cells are mildly increased (10% by  manual differential counts) with a few large forms.  Lymphocytes are not   significantly increased.  FISH showed t (11:14 ) translocation. Cytogenetics normal.  All questions were answered. The patient knows to call the clinic with any problems, questions or concerns.  I spent 30 minutes in the care of this patient including history, reviewing diagnostic criteria for myeloma, surveillance recommendations.    Benay Pike, MD 10/17/2021 9:45 AM

## 2021-10-18 LAB — IGG, IGA, IGM
IgA: 75 mg/dL (ref 61–437)
IgG (Immunoglobin G), Serum: 1068 mg/dL (ref 603–1613)
IgM (Immunoglobulin M), Srm: 67 mg/dL (ref 15–143)

## 2021-10-18 LAB — KAPPA/LAMBDA LIGHT CHAINS
Kappa free light chain: 25.9 mg/L — ABNORMAL HIGH (ref 3.3–19.4)
Kappa, lambda light chain ratio: 0.19 — ABNORMAL LOW (ref 0.26–1.65)
Lambda free light chains: 139.6 mg/L — ABNORMAL HIGH (ref 5.7–26.3)

## 2021-10-18 LAB — BETA 2 MICROGLOBULIN, SERUM: Beta-2 Microglobulin: 2.1 mg/L (ref 0.6–2.4)

## 2021-10-21 ENCOUNTER — Other Ambulatory Visit: Payer: Self-pay | Admitting: Hematology and Oncology

## 2021-10-21 ENCOUNTER — Other Ambulatory Visit: Payer: Self-pay | Admitting: *Deleted

## 2021-10-21 ENCOUNTER — Other Ambulatory Visit: Payer: Self-pay

## 2021-10-21 DIAGNOSIS — D472 Monoclonal gammopathy: Secondary | ICD-10-CM

## 2021-10-22 LAB — PROTEIN ELECTROPHORESIS, SERUM, WITH REFLEX
A/G Ratio: 1.4 (ref 0.7–1.7)
Albumin ELP: 3.9 g/dL (ref 2.9–4.4)
Alpha-1-Globulin: 0.2 g/dL (ref 0.0–0.4)
Alpha-2-Globulin: 0.7 g/dL (ref 0.4–1.0)
Beta Globulin: 0.9 g/dL (ref 0.7–1.3)
Gamma Globulin: 1 g/dL (ref 0.4–1.8)
Globulin, Total: 2.8 g/dL (ref 2.2–3.9)
M-Spike, %: 0.6 g/dL — ABNORMAL HIGH
SPEP Interpretation: 0
Total Protein ELP: 6.7 g/dL (ref 6.0–8.5)

## 2021-10-22 LAB — IMMUNOFIXATION REFLEX, SERUM
IgA: 86 mg/dL (ref 61–437)
IgG (Immunoglobin G), Serum: 1178 mg/dL (ref 603–1613)
IgM (Immunoglobulin M), Srm: 73 mg/dL (ref 15–143)

## 2021-10-23 ENCOUNTER — Other Ambulatory Visit: Payer: Self-pay | Admitting: *Deleted

## 2021-10-23 ENCOUNTER — Telehealth: Payer: Self-pay | Admitting: *Deleted

## 2021-10-23 NOTE — Telephone Encounter (Signed)
This RN left message on pt's identified VM informing him of stable labs done on 10/17/2021 per Dr Chryl Heck - but additional lab needed-   This RN's name given for return call.

## 2021-10-30 DIAGNOSIS — E1129 Type 2 diabetes mellitus with other diabetic kidney complication: Secondary | ICD-10-CM | POA: Diagnosis not present

## 2021-10-30 DIAGNOSIS — N400 Enlarged prostate without lower urinary tract symptoms: Secondary | ICD-10-CM | POA: Diagnosis not present

## 2021-10-30 DIAGNOSIS — R051 Acute cough: Secondary | ICD-10-CM | POA: Diagnosis not present

## 2021-10-30 DIAGNOSIS — J029 Acute pharyngitis, unspecified: Secondary | ICD-10-CM | POA: Diagnosis not present

## 2021-10-30 DIAGNOSIS — R062 Wheezing: Secondary | ICD-10-CM | POA: Diagnosis not present

## 2021-10-30 DIAGNOSIS — J309 Allergic rhinitis, unspecified: Secondary | ICD-10-CM | POA: Diagnosis not present

## 2021-10-30 DIAGNOSIS — R5383 Other fatigue: Secondary | ICD-10-CM | POA: Diagnosis not present

## 2021-10-30 DIAGNOSIS — J45909 Unspecified asthma, uncomplicated: Secondary | ICD-10-CM | POA: Diagnosis not present

## 2021-10-30 DIAGNOSIS — Z1152 Encounter for screening for COVID-19: Secondary | ICD-10-CM | POA: Diagnosis not present

## 2021-10-31 ENCOUNTER — Other Ambulatory Visit: Payer: Self-pay | Admitting: Hematology and Oncology

## 2021-10-31 ENCOUNTER — Encounter: Payer: Self-pay | Admitting: Hematology and Oncology

## 2021-10-31 NOTE — Progress Notes (Signed)
I called Mr Studnicka back. I discussed that his MM labs are stable. I did however discuss that his CMP was not done, he didn't have any recent labs done at Dr Avva's office. I apologized for his inconvenience, I am not sure why an active CMP order is not drawn. He said he would reach out back to [place] with available dates and times so we can schedule his CMP, asked him to call or send a my chart message to me and cc Hinda Lenis my nurse. I did discuss that we would like to know his kidney function as well if possible. I also sent a message to our lab manager to look into this.  Adely Facer

## 2021-11-04 ENCOUNTER — Inpatient Hospital Stay: Payer: HMO

## 2021-11-06 DIAGNOSIS — G720 Drug-induced myopathy: Secondary | ICD-10-CM | POA: Diagnosis not present

## 2021-11-06 DIAGNOSIS — E785 Hyperlipidemia, unspecified: Secondary | ICD-10-CM | POA: Diagnosis not present

## 2021-11-06 DIAGNOSIS — N400 Enlarged prostate without lower urinary tract symptoms: Secondary | ICD-10-CM | POA: Diagnosis not present

## 2021-11-06 DIAGNOSIS — M5418 Radiculopathy, sacral and sacrococcygeal region: Secondary | ICD-10-CM | POA: Diagnosis not present

## 2021-11-06 DIAGNOSIS — E1129 Type 2 diabetes mellitus with other diabetic kidney complication: Secondary | ICD-10-CM | POA: Diagnosis not present

## 2021-11-06 DIAGNOSIS — R051 Acute cough: Secondary | ICD-10-CM | POA: Diagnosis not present

## 2021-11-06 DIAGNOSIS — N1831 Chronic kidney disease, stage 3a: Secondary | ICD-10-CM | POA: Diagnosis not present

## 2021-11-06 DIAGNOSIS — M1990 Unspecified osteoarthritis, unspecified site: Secondary | ICD-10-CM | POA: Diagnosis not present

## 2021-11-06 DIAGNOSIS — I8390 Asymptomatic varicose veins of unspecified lower extremity: Secondary | ICD-10-CM | POA: Diagnosis not present

## 2021-11-06 DIAGNOSIS — I129 Hypertensive chronic kidney disease with stage 1 through stage 4 chronic kidney disease, or unspecified chronic kidney disease: Secondary | ICD-10-CM | POA: Diagnosis not present

## 2021-11-06 DIAGNOSIS — I493 Ventricular premature depolarization: Secondary | ICD-10-CM | POA: Diagnosis not present

## 2021-11-06 DIAGNOSIS — B351 Tinea unguium: Secondary | ICD-10-CM | POA: Diagnosis not present

## 2021-11-20 ENCOUNTER — Other Ambulatory Visit: Payer: HMO

## 2021-11-20 ENCOUNTER — Inpatient Hospital Stay: Payer: HMO | Attending: Hematology and Oncology

## 2021-11-20 DIAGNOSIS — D649 Anemia, unspecified: Secondary | ICD-10-CM | POA: Insufficient documentation

## 2021-11-20 DIAGNOSIS — D472 Monoclonal gammopathy: Secondary | ICD-10-CM | POA: Insufficient documentation

## 2021-11-25 ENCOUNTER — Telehealth: Payer: Self-pay | Admitting: Hematology and Oncology

## 2021-11-25 NOTE — Telephone Encounter (Signed)
Called patient to schedule appointment per 2/13 inbasket, patient is aware of date and time.

## 2021-11-29 ENCOUNTER — Other Ambulatory Visit: Payer: Self-pay

## 2021-11-29 ENCOUNTER — Inpatient Hospital Stay: Payer: HMO

## 2021-11-29 DIAGNOSIS — D472 Monoclonal gammopathy: Secondary | ICD-10-CM | POA: Diagnosis not present

## 2021-11-29 DIAGNOSIS — D649 Anemia, unspecified: Secondary | ICD-10-CM | POA: Diagnosis not present

## 2021-11-29 LAB — COMPREHENSIVE METABOLIC PANEL
ALT: 20 U/L (ref 0–44)
AST: 22 U/L (ref 15–41)
Albumin: 4.5 g/dL (ref 3.5–5.0)
Alkaline Phosphatase: 64 U/L (ref 38–126)
Anion gap: 9 (ref 5–15)
BUN: 31 mg/dL — ABNORMAL HIGH (ref 8–23)
CO2: 27 mmol/L (ref 22–32)
Calcium: 9.9 mg/dL (ref 8.9–10.3)
Chloride: 100 mmol/L (ref 98–111)
Creatinine, Ser: 1.28 mg/dL — ABNORMAL HIGH (ref 0.61–1.24)
GFR, Estimated: 58 mL/min — ABNORMAL LOW (ref >60)
Glucose, Bld: 122 mg/dL — ABNORMAL HIGH (ref 70–99)
Potassium: 4.7 mmol/L (ref 3.5–5.1)
Sodium: 136 mmol/L (ref 135–145)
Total Bilirubin: 0.3 mg/dL (ref 0.3–1.2)
Total Protein: 7.2 g/dL (ref 6.5–8.1)

## 2021-11-29 LAB — CBC WITH DIFFERENTIAL/PLATELET
Abs Immature Granulocytes: 0.02 10*3/uL (ref 0.00–0.07)
Basophils Absolute: 0.1 10*3/uL (ref 0.0–0.1)
Basophils Relative: 1 %
Eosinophils Absolute: 0.1 10*3/uL (ref 0.0–0.5)
Eosinophils Relative: 2 %
HCT: 34.8 % — ABNORMAL LOW (ref 39.0–52.0)
Hemoglobin: 11.2 g/dL — ABNORMAL LOW (ref 13.0–17.0)
Immature Granulocytes: 0 %
Lymphocytes Relative: 25 %
Lymphs Abs: 1.8 10*3/uL (ref 0.7–4.0)
MCH: 26.8 pg (ref 26.0–34.0)
MCHC: 32.2 g/dL (ref 30.0–36.0)
MCV: 83.3 fL (ref 80.0–100.0)
Monocytes Absolute: 0.5 10*3/uL (ref 0.1–1.0)
Monocytes Relative: 7 %
Neutro Abs: 4.8 10*3/uL (ref 1.7–7.7)
Neutrophils Relative %: 65 %
Platelets: 482 10*3/uL — ABNORMAL HIGH (ref 150–400)
RBC: 4.18 MIL/uL — ABNORMAL LOW (ref 4.22–5.81)
RDW: 16.5 % — ABNORMAL HIGH (ref 11.5–15.5)
WBC: 7.3 10*3/uL (ref 4.0–10.5)
nRBC: 0 % (ref 0.0–0.2)

## 2022-01-14 DIAGNOSIS — M539 Dorsopathy, unspecified: Secondary | ICD-10-CM | POA: Diagnosis not present

## 2022-01-14 DIAGNOSIS — R2242 Localized swelling, mass and lump, left lower limb: Secondary | ICD-10-CM | POA: Diagnosis not present

## 2022-01-30 DIAGNOSIS — K130 Diseases of lips: Secondary | ICD-10-CM | POA: Diagnosis not present

## 2022-01-30 DIAGNOSIS — R220 Localized swelling, mass and lump, head: Secondary | ICD-10-CM | POA: Diagnosis not present

## 2022-02-05 DIAGNOSIS — R220 Localized swelling, mass and lump, head: Secondary | ICD-10-CM | POA: Diagnosis not present

## 2022-02-05 DIAGNOSIS — K130 Diseases of lips: Secondary | ICD-10-CM | POA: Diagnosis not present

## 2022-02-07 DIAGNOSIS — L0101 Non-bullous impetigo: Secondary | ICD-10-CM | POA: Diagnosis not present

## 2022-02-07 DIAGNOSIS — B001 Herpesviral vesicular dermatitis: Secondary | ICD-10-CM | POA: Diagnosis not present

## 2022-03-24 DIAGNOSIS — R7989 Other specified abnormal findings of blood chemistry: Secondary | ICD-10-CM | POA: Diagnosis not present

## 2022-03-24 DIAGNOSIS — Z125 Encounter for screening for malignant neoplasm of prostate: Secondary | ICD-10-CM | POA: Diagnosis not present

## 2022-03-24 DIAGNOSIS — N1831 Chronic kidney disease, stage 3a: Secondary | ICD-10-CM | POA: Diagnosis not present

## 2022-03-24 DIAGNOSIS — E785 Hyperlipidemia, unspecified: Secondary | ICD-10-CM | POA: Diagnosis not present

## 2022-03-24 DIAGNOSIS — E1129 Type 2 diabetes mellitus with other diabetic kidney complication: Secondary | ICD-10-CM | POA: Diagnosis not present

## 2022-03-25 DIAGNOSIS — D649 Anemia, unspecified: Secondary | ICD-10-CM | POA: Diagnosis not present

## 2022-03-26 DIAGNOSIS — Z0000 Encounter for general adult medical examination without abnormal findings: Secondary | ICD-10-CM | POA: Diagnosis not present

## 2022-03-31 DIAGNOSIS — Z23 Encounter for immunization: Secondary | ICD-10-CM | POA: Diagnosis not present

## 2022-03-31 DIAGNOSIS — E785 Hyperlipidemia, unspecified: Secondary | ICD-10-CM | POA: Diagnosis not present

## 2022-03-31 DIAGNOSIS — N1831 Chronic kidney disease, stage 3a: Secondary | ICD-10-CM | POA: Diagnosis not present

## 2022-03-31 DIAGNOSIS — E1129 Type 2 diabetes mellitus with other diabetic kidney complication: Secondary | ICD-10-CM | POA: Diagnosis not present

## 2022-03-31 DIAGNOSIS — D509 Iron deficiency anemia, unspecified: Secondary | ICD-10-CM | POA: Diagnosis not present

## 2022-03-31 DIAGNOSIS — I493 Ventricular premature depolarization: Secondary | ICD-10-CM | POA: Diagnosis not present

## 2022-03-31 DIAGNOSIS — Z0000 Encounter for general adult medical examination without abnormal findings: Secondary | ICD-10-CM | POA: Diagnosis not present

## 2022-03-31 DIAGNOSIS — G720 Drug-induced myopathy: Secondary | ICD-10-CM | POA: Diagnosis not present

## 2022-03-31 DIAGNOSIS — K219 Gastro-esophageal reflux disease without esophagitis: Secondary | ICD-10-CM | POA: Diagnosis not present

## 2022-03-31 DIAGNOSIS — R82998 Other abnormal findings in urine: Secondary | ICD-10-CM | POA: Diagnosis not present

## 2022-03-31 DIAGNOSIS — I129 Hypertensive chronic kidney disease with stage 1 through stage 4 chronic kidney disease, or unspecified chronic kidney disease: Secondary | ICD-10-CM | POA: Diagnosis not present

## 2022-04-17 ENCOUNTER — Other Ambulatory Visit: Payer: Self-pay | Admitting: *Deleted

## 2022-04-17 DIAGNOSIS — D649 Anemia, unspecified: Secondary | ICD-10-CM

## 2022-04-17 DIAGNOSIS — D472 Monoclonal gammopathy: Secondary | ICD-10-CM

## 2022-04-17 DIAGNOSIS — M549 Dorsalgia, unspecified: Secondary | ICD-10-CM

## 2022-04-18 ENCOUNTER — Inpatient Hospital Stay: Payer: HMO | Attending: Hematology and Oncology

## 2022-04-18 ENCOUNTER — Inpatient Hospital Stay: Payer: HMO | Admitting: Hematology and Oncology

## 2022-04-18 NOTE — Progress Notes (Deleted)
[name] CONSULT NOTE  Patient Care Team: Prince Solian, MD as PCP - General (Internal Medicine)  CHIEF COMPLAINTS/PURPOSE OF CONSULTATION:  Suspicion for cancer, follow up after additional investigation.  ASSESSMENT & PLAN:   This is a very pleasant 76 yr old male patient with PMH significant for HTN referred to hematology for evaluation of acute low back pain, indeterminate lesion in L4 MRI lumbar spine and some weight loss, referred to oncology for further recommendations.  1. MGUS, Ig G Lambda subtype, abnormal K/L ratio. Mildly elevated creatinine. Hb mildly low. Given abnormal K/L ratio, I recommended proceeding with BMB. BMB showed 10-15% plasma cell by IHC.  I have reviewed his CBC and BMP for the last 3 yrs at least, his average Hb is 12 gms/dl and creatinine is near baseline ( his creatinine has been around 1.3) NO evidence of bone lesions on bone survey. At this time there appears to be no evidence of end organ damage from MM. His only site of pain has been evaluated by MRI which didn't show evidence of any lytic lesions, L4 indeterminate lesion was thought to be a hemangioma according to my discussion with radiology. He is here for follow-up for his MGUS.      2. Family history of ovarian cancer. I have referred him to genetic counselor, he canceled the appointment and he is not really interested in scheduling another appointment at this time.  3. Low back pain likely from sciatica, improved remarkably compared to previous visit. This has been intermittently bothering him. Again he follows up with physiotherapy as mentioned above.  RTC in 6 months for FU with repeat labs.  HISTORY OF PRESENTING ILLNESS:   Allen Hartman. 76 y.o. male is here because of suspicion for cancer and an indeterminate lesion found on most recent MRI without contrast of his lumbar spine.  Interval History  Since last visit, back pain continues to be an intermittent  issue. He has improved significantly in terms of back pain.  He continues to work with physiotherapy and had some dry needling on December 26 which has worked remarkably well.  Is hoping to go back to the gym soon and do some mild exercises. No B symptoms.  No new bone pains.  No change in bowel habits.  No hematochezia or melena. No bowel or bladder retention or incontinence issues. Rest of the pertinent 10 point ROS reviewed and negative.  MEDICAL HISTORY:  Past Medical History:  Diagnosis Date   Hypertension     SURGICAL HISTORY: No past surgical history on file.  SOCIAL HISTORY: Social History   Socioeconomic History   Marital status: Married    Spouse name: Not on file   Number of children: Not on file   Years of education: Not on file   Highest education level: Not on file  Occupational History   Not on file  Tobacco Use   Smoking status: Not on file   Smokeless tobacco: Not on file  Substance and Sexual Activity   Alcohol use: Not on file   Drug use: Not on file   Sexual activity: Not on file  Other Topics Concern   Not on file  Social History Narrative   Not on file   Social Determinants of Health   Financial Resource Strain: Not on file  Food Insecurity: No Food Insecurity (05/18/2019)   Hunger Vital Sign    Worried About Running Out of Food in the Last Year: Never true  Ran Out of Food in the Last Year: Never true  Transportation Needs: Not on file  Physical Activity: Not on file  Stress: Not on file  Social Connections: Not on file  Intimate Partner Violence: Not on file    FAMILY HISTORY: Family History  Problem Relation Age of Onset   Cancer Sister    Cancer Sister    Cancer Sister     ALLERGIES:  is allergic to sulfa antibiotics.  MEDICATIONS:  Current Outpatient Medications  Medication Sig Dispense Refill   cetirizine (ZYRTEC) 10 MG tablet      CONCERTA 27 MG CR tablet Take 27 mg by mouth every morning.     Diclofenac Sodium 3 % GEL  Apply topically.     insulin glargine (LANTUS SOLOSTAR) 100 UNIT/ML Solostar Pen INJECT 5-7 UNITS [place] Q EVENING BEFORE BED     irbesartan (AVAPRO) 300 MG tablet Take 300 mg by mouth daily.     KOMBIGLYZE XR 2.02-999 MG TB24 Take 1 tablet by mouth 2 (two) times daily.     ONETOUCH VERIO test strip 3 (three) times daily.     pantoprazole (PROTONIX) 40 MG tablet Take 1 tablet by mouth 2 (two) times daily.     tamsulosin (FLOMAX) 0.4 MG CAPS capsule SMARTSIG:1 Capsule(s) By Mouth Every Evening     No current facility-administered medications for this visit.     PHYSICAL EXAMINATION:  ECOG PERFORMANCE STATUS: 2 - Symptomatic, <50% confined to bed  There were no vitals filed for this visit.  There were no vitals filed for this visit. Marland Kitchen Physical Exam Constitutional:      Appearance: Normal appearance.  HENT:     Head: Normocephalic and atraumatic.  Cardiovascular:     Rate and Rhythm: Normal rate and regular rhythm.  Pulmonary:     Effort: Pulmonary effort is normal.     Breath sounds: Normal breath sounds.  Abdominal:     General: There is no distension.     Palpations: There is no mass.     Tenderness: There is no abdominal tenderness.  Musculoskeletal:        General: No swelling or tenderness.     Cervical back: Normal range of motion. No rigidity.  Lymphadenopathy:     Cervical: No cervical adenopathy.  Skin:    General: Skin is warm and dry.  Neurological:     Mental Status: He is alert and oriented to person, place, and time.  Psychiatric:        Mood and Affect: Mood normal.      LABORATORY DATA:  I have reviewed the data as listed Lab Results  Component Value Date   WBC 7.3 11/29/2021   HGB 11.2 (L) 11/29/2021   HCT 34.8 (L) 11/29/2021   MCV 83.3 11/29/2021   PLT 482 (H) 11/29/2021     Chemistry      Component Value Date/Time   NA 136 11/29/2021 1342   K 4.7 11/29/2021 1342   CL 100 11/29/2021 1342   CO2 27 11/29/2021 1342   BUN 31 (H) 11/29/2021  1342   CREATININE 1.28 (H) 11/29/2021 1342   CREATININE 1.41 (H) 02/11/2021 1210      Component Value Date/Time   CALCIUM 9.9 11/29/2021 1342   ALKPHOS 64 11/29/2021 1342   AST 22 11/29/2021 1342   AST 15 02/11/2021 1210   ALT 20 11/29/2021 1342   ALT 10 02/11/2021 1210   BILITOT 0.3 11/29/2021 1342   BILITOT 0.4 02/11/2021 1210  RADIOGRAPHIC STUDIES: I have personally reviewed the radiological images as listed and agreed with the findings in the report. No results found.  03/04/2021  IMPRESSION: No focal lytic lesions identified. Multifocal degenerative changes as described above.   02/18/2021  IMPRESSION: Stable indeterminate L4 vertebral body lesion compared to recent prior study.   Postoperative changes at L5-S1 with scar tissue along the descending left S1 nerve root.   According to my discussion with radiology, he felt this is likely a hemangioma.  Labs review  Anemia, Hb of 12.6, plt 497 SPEP, M spike of 0.6 g/dl, Ig G lambda Creatinine of 1.4, calcium of 10.4, BUN of 35 K/L ratio of 0.2 PSA normal.  BMB reports reviewed  DIAGNOSIS:   BONE MARROW, ASPIRATE, CLOT, CORE:  - Plasma cell myeloma, see comment.  - Minimal iron stores.   PERIPHERAL BLOOD:  - Normocytic anemia.  - Mild thrombocytosis.   COMMENT:   The bone marrow is normocellular but exhibits increased monoclonal  plasma cells (10% aspirate, 15-20% CD138 immunohistochemistry). These  findings are consistent with plasma cell myeloma.   MICROSCOPIC DESCRIPTION:   PERIPHERAL BLOOD SMEAR: There is a normocytic anemia with occasional  elliptocytes.  Rouleaux formation is not identified.  Leukocytes are  present in normal numbers with unremarkable morphology.  Circulating  plasma cells are not identified.  Platelets are mildly increased in  numbers.   BONE MARROW ASPIRATE: Spicular and cellular.  Erythroid precursors: Present in appropriate proportions.  No  significant dysplasia.   Granulocytic precursors: Present in appropriate proportions.  No  significant dysplasia.  No increase in blasts.  Megakaryocytes: Present with a few hypolobated forms.  Lymphocytes/plasma cells: Plasma cells are mildly increased (10% by  manual differential counts) with a few large forms.  Lymphocytes are not  significantly increased.  FISH showed t (11:14 ) translocation. Cytogenetics normal.  All questions were answered. The patient knows to call the clinic with any problems, questions or concerns.  Labs from today are pending.  CBC is essentially stable  I spent 20 minutes in the care of this patient including history, reviewing diagnostic criteria for myeloma, surveillance recommendations.    Allen Pike, MD 04/18/2022 8:24 AM

## 2022-04-28 ENCOUNTER — Other Ambulatory Visit: Payer: Self-pay | Admitting: Physician Assistant

## 2022-04-28 DIAGNOSIS — K219 Gastro-esophageal reflux disease without esophagitis: Secondary | ICD-10-CM | POA: Diagnosis not present

## 2022-04-28 DIAGNOSIS — R197 Diarrhea, unspecified: Secondary | ICD-10-CM | POA: Diagnosis not present

## 2022-04-28 DIAGNOSIS — R109 Unspecified abdominal pain: Secondary | ICD-10-CM | POA: Diagnosis not present

## 2022-04-28 DIAGNOSIS — D509 Iron deficiency anemia, unspecified: Secondary | ICD-10-CM | POA: Diagnosis not present

## 2022-04-28 DIAGNOSIS — R112 Nausea with vomiting, unspecified: Secondary | ICD-10-CM | POA: Diagnosis not present

## 2022-05-15 ENCOUNTER — Ambulatory Visit
Admission: RE | Admit: 2022-05-15 | Discharge: 2022-05-15 | Disposition: A | Discharge status: Home / Self Care | Payer: HMO | Source: Ambulatory Visit | Attending: Physician Assistant | Admitting: Physician Assistant

## 2022-05-15 DIAGNOSIS — K4090 Unilateral inguinal hernia, without obstruction or gangrene, not specified as recurrent: Secondary | ICD-10-CM | POA: Diagnosis not present

## 2022-05-15 DIAGNOSIS — K5730 Diverticulosis of large intestine without perforation or abscess without bleeding: Secondary | ICD-10-CM | POA: Diagnosis not present

## 2022-05-15 DIAGNOSIS — K7689 Other specified diseases of liver: Secondary | ICD-10-CM | POA: Diagnosis not present

## 2022-05-15 DIAGNOSIS — R109 Unspecified abdominal pain: Secondary | ICD-10-CM

## 2022-05-15 DIAGNOSIS — N2889 Other specified disorders of kidney and ureter: Secondary | ICD-10-CM | POA: Diagnosis not present

## 2022-05-15 MED ORDER — IOPAMIDOL (ISOVUE-300) INJECTION 61%
100.0000 mL | Freq: Once | INTRAVENOUS | Status: AC | PRN
  Administered 2022-05-15: 100 mL via INTRAVENOUS

## 2022-06-11 DIAGNOSIS — D509 Iron deficiency anemia, unspecified: Secondary | ICD-10-CM | POA: Diagnosis not present

## 2022-06-11 DIAGNOSIS — K219 Gastro-esophageal reflux disease without esophagitis: Secondary | ICD-10-CM | POA: Diagnosis not present

## 2022-06-11 DIAGNOSIS — R1030 Lower abdominal pain, unspecified: Secondary | ICD-10-CM | POA: Diagnosis not present

## 2022-06-11 DIAGNOSIS — R198 Other specified symptoms and signs involving the digestive system and abdomen: Secondary | ICD-10-CM | POA: Diagnosis not present

## 2022-06-13 DIAGNOSIS — K219 Gastro-esophageal reflux disease without esophagitis: Secondary | ICD-10-CM | POA: Diagnosis not present

## 2022-06-13 DIAGNOSIS — K449 Diaphragmatic hernia without obstruction or gangrene: Secondary | ICD-10-CM | POA: Diagnosis not present

## 2022-06-13 DIAGNOSIS — K2970 Gastritis, unspecified, without bleeding: Secondary | ICD-10-CM | POA: Diagnosis not present

## 2022-06-13 DIAGNOSIS — K6289 Other specified diseases of anus and rectum: Secondary | ICD-10-CM | POA: Diagnosis not present

## 2022-06-13 DIAGNOSIS — K5730 Diverticulosis of large intestine without perforation or abscess without bleeding: Secondary | ICD-10-CM | POA: Diagnosis not present

## 2022-06-13 DIAGNOSIS — D124 Benign neoplasm of descending colon: Secondary | ICD-10-CM | POA: Diagnosis not present

## 2022-06-13 DIAGNOSIS — D123 Benign neoplasm of transverse colon: Secondary | ICD-10-CM | POA: Diagnosis not present

## 2022-06-13 DIAGNOSIS — D509 Iron deficiency anemia, unspecified: Secondary | ICD-10-CM | POA: Diagnosis not present

## 2022-06-13 DIAGNOSIS — K2930 Chronic superficial gastritis without bleeding: Secondary | ICD-10-CM | POA: Diagnosis not present

## 2022-06-18 DIAGNOSIS — D124 Benign neoplasm of descending colon: Secondary | ICD-10-CM | POA: Diagnosis not present

## 2022-06-18 DIAGNOSIS — K2930 Chronic superficial gastritis without bleeding: Secondary | ICD-10-CM | POA: Diagnosis not present

## 2022-06-19 DIAGNOSIS — S91001A Unspecified open wound, right ankle, initial encounter: Secondary | ICD-10-CM | POA: Diagnosis not present

## 2022-06-19 DIAGNOSIS — I129 Hypertensive chronic kidney disease with stage 1 through stage 4 chronic kidney disease, or unspecified chronic kidney disease: Secondary | ICD-10-CM | POA: Diagnosis not present

## 2022-10-28 DIAGNOSIS — Z1152 Encounter for screening for COVID-19: Secondary | ICD-10-CM | POA: Diagnosis not present

## 2022-10-28 DIAGNOSIS — J101 Influenza due to other identified influenza virus with other respiratory manifestations: Secondary | ICD-10-CM | POA: Diagnosis not present

## 2022-10-28 DIAGNOSIS — R638 Other symptoms and signs concerning food and fluid intake: Secondary | ICD-10-CM | POA: Diagnosis not present

## 2022-10-28 DIAGNOSIS — R058 Other specified cough: Secondary | ICD-10-CM | POA: Diagnosis not present

## 2022-10-28 DIAGNOSIS — M1990 Unspecified osteoarthritis, unspecified site: Secondary | ICD-10-CM | POA: Diagnosis not present

## 2022-10-28 DIAGNOSIS — R0981 Nasal congestion: Secondary | ICD-10-CM | POA: Diagnosis not present

## 2022-10-28 DIAGNOSIS — G43909 Migraine, unspecified, not intractable, without status migrainosus: Secondary | ICD-10-CM | POA: Diagnosis not present

## 2022-11-28 DIAGNOSIS — N1831 Chronic kidney disease, stage 3a: Secondary | ICD-10-CM | POA: Diagnosis not present

## 2022-11-28 DIAGNOSIS — D509 Iron deficiency anemia, unspecified: Secondary | ICD-10-CM | POA: Diagnosis not present

## 2022-11-28 DIAGNOSIS — I129 Hypertensive chronic kidney disease with stage 1 through stage 4 chronic kidney disease, or unspecified chronic kidney disease: Secondary | ICD-10-CM | POA: Diagnosis not present

## 2022-11-28 DIAGNOSIS — B351 Tinea unguium: Secondary | ICD-10-CM | POA: Diagnosis not present

## 2022-11-28 DIAGNOSIS — M25551 Pain in right hip: Secondary | ICD-10-CM | POA: Diagnosis not present

## 2022-11-28 DIAGNOSIS — E1129 Type 2 diabetes mellitus with other diabetic kidney complication: Secondary | ICD-10-CM | POA: Diagnosis not present

## 2022-11-28 DIAGNOSIS — M1990 Unspecified osteoarthritis, unspecified site: Secondary | ICD-10-CM | POA: Diagnosis not present

## 2022-11-28 DIAGNOSIS — I493 Ventricular premature depolarization: Secondary | ICD-10-CM | POA: Diagnosis not present

## 2022-11-28 DIAGNOSIS — K219 Gastro-esophageal reflux disease without esophagitis: Secondary | ICD-10-CM | POA: Diagnosis not present

## 2022-11-28 DIAGNOSIS — N400 Enlarged prostate without lower urinary tract symptoms: Secondary | ICD-10-CM | POA: Diagnosis not present

## 2022-11-28 DIAGNOSIS — G720 Drug-induced myopathy: Secondary | ICD-10-CM | POA: Diagnosis not present

## 2022-11-28 DIAGNOSIS — E785 Hyperlipidemia, unspecified: Secondary | ICD-10-CM | POA: Diagnosis not present

## 2022-12-12 DIAGNOSIS — B002 Herpesviral gingivostomatitis and pharyngotonsillitis: Secondary | ICD-10-CM | POA: Diagnosis not present

## 2022-12-12 DIAGNOSIS — H6123 Impacted cerumen, bilateral: Secondary | ICD-10-CM | POA: Diagnosis not present

## 2022-12-12 DIAGNOSIS — M1990 Unspecified osteoarthritis, unspecified site: Secondary | ICD-10-CM | POA: Diagnosis not present

## 2022-12-12 DIAGNOSIS — H9193 Unspecified hearing loss, bilateral: Secondary | ICD-10-CM | POA: Diagnosis not present

## 2022-12-12 DIAGNOSIS — M5418 Radiculopathy, sacral and sacrococcygeal region: Secondary | ICD-10-CM | POA: Diagnosis not present

## 2022-12-12 DIAGNOSIS — M25551 Pain in right hip: Secondary | ICD-10-CM | POA: Diagnosis not present

## 2022-12-24 DIAGNOSIS — H903 Sensorineural hearing loss, bilateral: Secondary | ICD-10-CM | POA: Diagnosis not present

## 2023-04-20 DIAGNOSIS — D509 Iron deficiency anemia, unspecified: Secondary | ICD-10-CM | POA: Diagnosis not present

## 2023-04-20 DIAGNOSIS — E785 Hyperlipidemia, unspecified: Secondary | ICD-10-CM | POA: Diagnosis not present

## 2023-04-20 DIAGNOSIS — E538 Deficiency of other specified B group vitamins: Secondary | ICD-10-CM | POA: Diagnosis not present

## 2023-04-20 DIAGNOSIS — Z1212 Encounter for screening for malignant neoplasm of rectum: Secondary | ICD-10-CM | POA: Diagnosis not present

## 2023-04-20 DIAGNOSIS — I129 Hypertensive chronic kidney disease with stage 1 through stage 4 chronic kidney disease, or unspecified chronic kidney disease: Secondary | ICD-10-CM | POA: Diagnosis not present

## 2023-04-20 DIAGNOSIS — N1831 Chronic kidney disease, stage 3a: Secondary | ICD-10-CM | POA: Diagnosis not present

## 2023-04-20 DIAGNOSIS — E1129 Type 2 diabetes mellitus with other diabetic kidney complication: Secondary | ICD-10-CM | POA: Diagnosis not present

## 2023-04-20 DIAGNOSIS — N400 Enlarged prostate without lower urinary tract symptoms: Secondary | ICD-10-CM | POA: Diagnosis not present

## 2023-04-27 DIAGNOSIS — K219 Gastro-esophageal reflux disease without esophagitis: Secondary | ICD-10-CM | POA: Diagnosis not present

## 2023-04-27 DIAGNOSIS — Z0000 Encounter for general adult medical examination without abnormal findings: Secondary | ICD-10-CM | POA: Diagnosis not present

## 2023-04-27 DIAGNOSIS — Z13828 Encounter for screening for other musculoskeletal disorder: Secondary | ICD-10-CM | POA: Diagnosis not present

## 2023-04-27 DIAGNOSIS — Z1331 Encounter for screening for depression: Secondary | ICD-10-CM | POA: Diagnosis not present

## 2023-04-27 DIAGNOSIS — R82998 Other abnormal findings in urine: Secondary | ICD-10-CM | POA: Diagnosis not present

## 2023-04-27 DIAGNOSIS — Z1339 Encounter for screening examination for other mental health and behavioral disorders: Secondary | ICD-10-CM | POA: Diagnosis not present

## 2023-04-27 DIAGNOSIS — N1831 Chronic kidney disease, stage 3a: Secondary | ICD-10-CM | POA: Diagnosis not present

## 2023-04-27 DIAGNOSIS — I129 Hypertensive chronic kidney disease with stage 1 through stage 4 chronic kidney disease, or unspecified chronic kidney disease: Secondary | ICD-10-CM | POA: Diagnosis not present

## 2023-04-27 DIAGNOSIS — E1129 Type 2 diabetes mellitus with other diabetic kidney complication: Secondary | ICD-10-CM | POA: Diagnosis not present

## 2023-04-27 DIAGNOSIS — E785 Hyperlipidemia, unspecified: Secondary | ICD-10-CM | POA: Diagnosis not present

## 2023-04-27 DIAGNOSIS — D509 Iron deficiency anemia, unspecified: Secondary | ICD-10-CM | POA: Diagnosis not present

## 2023-04-27 DIAGNOSIS — M1990 Unspecified osteoarthritis, unspecified site: Secondary | ICD-10-CM | POA: Diagnosis not present

## 2023-04-27 DIAGNOSIS — G720 Drug-induced myopathy: Secondary | ICD-10-CM | POA: Diagnosis not present

## 2023-08-31 DIAGNOSIS — E1129 Type 2 diabetes mellitus with other diabetic kidney complication: Secondary | ICD-10-CM | POA: Diagnosis not present

## 2023-08-31 DIAGNOSIS — M25571 Pain in right ankle and joints of right foot: Secondary | ICD-10-CM | POA: Diagnosis not present

## 2023-08-31 DIAGNOSIS — M7660 Achilles tendinitis, unspecified leg: Secondary | ICD-10-CM | POA: Diagnosis not present

## 2023-09-08 DIAGNOSIS — D509 Iron deficiency anemia, unspecified: Secondary | ICD-10-CM | POA: Diagnosis not present

## 2023-09-08 DIAGNOSIS — E1129 Type 2 diabetes mellitus with other diabetic kidney complication: Secondary | ICD-10-CM | POA: Diagnosis not present

## 2023-09-08 DIAGNOSIS — M1990 Unspecified osteoarthritis, unspecified site: Secondary | ICD-10-CM | POA: Diagnosis not present

## 2023-09-08 DIAGNOSIS — M5418 Radiculopathy, sacral and sacrococcygeal region: Secondary | ICD-10-CM | POA: Diagnosis not present

## 2023-09-08 DIAGNOSIS — M25551 Pain in right hip: Secondary | ICD-10-CM | POA: Diagnosis not present

## 2023-09-08 DIAGNOSIS — G720 Drug-induced myopathy: Secondary | ICD-10-CM | POA: Diagnosis not present

## 2023-09-08 DIAGNOSIS — M7660 Achilles tendinitis, unspecified leg: Secondary | ICD-10-CM | POA: Diagnosis not present

## 2023-09-08 DIAGNOSIS — Z23 Encounter for immunization: Secondary | ICD-10-CM | POA: Diagnosis not present

## 2023-09-08 DIAGNOSIS — N1831 Chronic kidney disease, stage 3a: Secondary | ICD-10-CM | POA: Diagnosis not present

## 2023-09-08 DIAGNOSIS — E785 Hyperlipidemia, unspecified: Secondary | ICD-10-CM | POA: Diagnosis not present

## 2023-09-08 DIAGNOSIS — I129 Hypertensive chronic kidney disease with stage 1 through stage 4 chronic kidney disease, or unspecified chronic kidney disease: Secondary | ICD-10-CM | POA: Diagnosis not present

## 2023-09-08 DIAGNOSIS — K219 Gastro-esophageal reflux disease without esophagitis: Secondary | ICD-10-CM | POA: Diagnosis not present

## 2023-09-09 ENCOUNTER — Ambulatory Visit (INDEPENDENT_AMBULATORY_CARE_PROVIDER_SITE_OTHER): Payer: HMO | Admitting: Family

## 2023-09-09 ENCOUNTER — Encounter: Payer: Self-pay | Admitting: Family

## 2023-09-09 DIAGNOSIS — M6701 Short Achilles tendon (acquired), right ankle: Secondary | ICD-10-CM

## 2023-09-09 DIAGNOSIS — M7661 Achilles tendinitis, right leg: Secondary | ICD-10-CM

## 2023-09-09 MED ORDER — NITROGLYCERIN 0.2 MG/HR TD PT24: 0.2000 mg | MEDICATED_PATCH | Freq: Every day | TRANSDERMAL | 3 refills | Status: AC

## 2023-09-09 MED ORDER — PREDNISONE 10 MG PO TABS: 10.0000 mg | ORAL_TABLET | Freq: Every day | ORAL | 0 refills | Status: AC

## 2023-09-09 NOTE — Progress Notes (Signed)
Office Visit Note   Patient: Allen Hartman.           Date of Birth: September 14, 1946           MRN: [id number] Visit Date: 09/09/2023              Requested by: Chilton Greathouse, MD [address] [place],  [place] [address] PCP: Chilton Greathouse, MD  Chief Complaint  Patient presents with   Right Ankle - Pain      HPI: The patient is a 77 year old gentleman who presents for initial evaluation of right heel cord pain.  He reports this came on after a long distance drive in the car.  No associated injury he has never had anything like this before.  This has been ongoing now for nearly 4 weeks he has had little improvement he has been using Voltaren gel about 4 times a day he reports he has had some decrease in the swelling to his Achilles on the right however has not had much change in his pain he did attempt some heel and ankle stretching but felt this made his pain worse  Has tried using a cam boot but this worsened his low back pain  Assessment & Plan: Visit Diagnoses: No diagnosis found.  Plan: Unable to tolerate NSAIDs.  Will place him on a course of 10 mg of prednisone he will also use nitroglycerin patch to the posterior Achilles.  Offered a heel lift for his shoe wear  Follow-Up Instructions: Return in about 2 weeks (around 09/23/2023), or if symptoms worsen or fail to improve.   Ortho Exam  Patient is alert, oriented, no adenopathy, well-dressed, normal affect, normal respiratory effort.  headache from a examination of the right lower extremity there is no edema there is no erythema he has mild prominence at the mid stump substance of the Achilles on the right there is no palpable cord or defect.  Tenderness to the mid substance of the Achilles there is no insertional tenderness.  He does have heel cord tightness with dorsiflexion just shy of neutral  Imaging: No results found. No images are attached to the encounter.  Labs: Lab Results  Component Value Date   CRP  0.6 03/22/2021     Lab Results  Component Value Date   ALBUMIN 4.5 11/29/2021   ALBUMIN 4.2 02/11/2021   ALBUMIN 3.4 (L) 06/20/2010    No results found for: "MG" No results found for: "VD25OH"  No results found for: "PREALBUMIN"    Latest Ref Rng & Units 11/29/2021    1:42 PM 10/17/2021    9:08 AM 03/22/2021    8:46 AM  CBC EXTENDED  WBC 4.0 - 10.5 K/uL 7.3  7.2  7.5   RBC 4.22 - 5.81 MIL/uL 4.18  4.20  3.93   Hemoglobin 13.0 - 17.0 g/dL 65.7  84.6  96.2   HCT 39.0 - 52.0 % 34.8  35.3  33.0   Platelets 150 - 400 K/uL 482  430  423   NEUT# 1.7 - 7.7 K/uL 4.8  3.5  5.5   Lymph# 0.7 - 4.0 K/uL 1.8  2.7  1.2      There is no height or weight on file to calculate BMI.  Orders:  No orders of the defined types were placed in this encounter.  No orders of the defined types were placed in this encounter.    Procedures: No procedures performed  Clinical Data: No additional findings.  ROS:  All  other systems negative, except as noted in the HPI. Review of Systems  Objective: Vital Signs: There were no vitals taken for this visit.  Specialty Comments:  No specialty comments available.  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   Back injury 03/06/2021   Ventricular premature depolarization 01/17/2020   Chronic kidney disease, stage 3a (HCC) 01/17/2020   Asthma without status asthmaticus 09/24/2018   Long term (current) use of insulin (HCC) 10/26/2017   Varicose veins of lower extremity 05/15/2017   Acute stress reaction 04/21/2017   Diabetic renal disease (HCC) 05/02/2015   Gastro-esophageal reflux disease without esophagitis 12/29/2013   Benign prostatic hyperplasia without lower urinary tract symptoms 02/02/2012   ED (erectile dysfunction) of organic origin 10/22/2011   Essential  hypertension 07/09/2011   Attention deficit hyperactivity disorder 07/09/2011   Past Medical History:  Diagnosis Date   Hypertension     Family History  Problem Relation Age of Onset   Cancer Sister    Cancer Sister    Cancer Sister     History reviewed. No pertinent surgical history. Social History   Occupational History   Not on file  Tobacco Use   Smoking status: Not on file   Smokeless tobacco: Not on file  Substance and Sexual Activity   Alcohol use: Not on file   Drug use: Not on file   Sexual activity: Not on file

## 2023-10-16 DIAGNOSIS — J029 Acute pharyngitis, unspecified: Secondary | ICD-10-CM | POA: Diagnosis not present

## 2023-10-16 DIAGNOSIS — R0981 Nasal congestion: Secondary | ICD-10-CM | POA: Diagnosis not present

## 2023-10-16 DIAGNOSIS — Z1152 Encounter for screening for COVID-19: Secondary | ICD-10-CM | POA: Diagnosis not present

## 2023-10-16 DIAGNOSIS — R051 Acute cough: Secondary | ICD-10-CM | POA: Diagnosis not present

## 2023-10-16 DIAGNOSIS — U071 COVID-19: Secondary | ICD-10-CM | POA: Diagnosis not present

## 2023-10-16 DIAGNOSIS — R5383 Other fatigue: Secondary | ICD-10-CM | POA: Diagnosis not present

## 2023-10-29 DIAGNOSIS — R051 Acute cough: Secondary | ICD-10-CM | POA: Diagnosis not present

## 2023-10-29 DIAGNOSIS — U071 COVID-19: Secondary | ICD-10-CM | POA: Diagnosis not present

## 2023-10-29 DIAGNOSIS — J208 Acute bronchitis due to other specified organisms: Secondary | ICD-10-CM | POA: Diagnosis not present

## 2023-10-29 DIAGNOSIS — E1129 Type 2 diabetes mellitus with other diabetic kidney complication: Secondary | ICD-10-CM | POA: Diagnosis not present

## 2024-01-08 DIAGNOSIS — M7660 Achilles tendinitis, unspecified leg: Secondary | ICD-10-CM | POA: Diagnosis not present

## 2024-01-08 DIAGNOSIS — N1831 Chronic kidney disease, stage 3a: Secondary | ICD-10-CM | POA: Diagnosis not present

## 2024-01-08 DIAGNOSIS — R197 Diarrhea, unspecified: Secondary | ICD-10-CM | POA: Diagnosis not present

## 2024-01-08 DIAGNOSIS — M5418 Radiculopathy, sacral and sacrococcygeal region: Secondary | ICD-10-CM | POA: Diagnosis not present

## 2024-01-08 DIAGNOSIS — E1129 Type 2 diabetes mellitus with other diabetic kidney complication: Secondary | ICD-10-CM | POA: Diagnosis not present

## 2024-01-08 DIAGNOSIS — D509 Iron deficiency anemia, unspecified: Secondary | ICD-10-CM | POA: Diagnosis not present

## 2024-01-08 DIAGNOSIS — F909 Attention-deficit hyperactivity disorder, unspecified type: Secondary | ICD-10-CM | POA: Diagnosis not present

## 2024-01-08 DIAGNOSIS — N183 Chronic kidney disease, stage 3 unspecified: Secondary | ICD-10-CM | POA: Diagnosis not present

## 2024-01-08 DIAGNOSIS — I493 Ventricular premature depolarization: Secondary | ICD-10-CM | POA: Diagnosis not present

## 2024-01-08 DIAGNOSIS — G720 Drug-induced myopathy: Secondary | ICD-10-CM | POA: Diagnosis not present

## 2024-01-08 DIAGNOSIS — E785 Hyperlipidemia, unspecified: Secondary | ICD-10-CM | POA: Diagnosis not present

## 2024-01-08 DIAGNOSIS — I129 Hypertensive chronic kidney disease with stage 1 through stage 4 chronic kidney disease, or unspecified chronic kidney disease: Secondary | ICD-10-CM | POA: Diagnosis not present

## 2024-01-08 DIAGNOSIS — J45909 Unspecified asthma, uncomplicated: Secondary | ICD-10-CM | POA: Diagnosis not present

## 2024-01-11 DIAGNOSIS — M545 Low back pain, unspecified: Secondary | ICD-10-CM | POA: Diagnosis not present

## 2024-02-02 DIAGNOSIS — M545 Low back pain, unspecified: Secondary | ICD-10-CM | POA: Diagnosis not present

## 2024-02-02 DIAGNOSIS — M25571 Pain in right ankle and joints of right foot: Secondary | ICD-10-CM | POA: Diagnosis not present

## 2024-02-04 ENCOUNTER — Ambulatory Visit (HOSPITAL_COMMUNITY)
Admission: RE | Admit: 2024-02-04 | Discharge: 2024-02-04 | Disposition: A | Discharge status: Home / Self Care | Source: Ambulatory Visit | Attending: Medical | Admitting: Medical

## 2024-02-04 ENCOUNTER — Other Ambulatory Visit (HOSPITAL_COMMUNITY): Payer: Self-pay | Admitting: Medical

## 2024-02-04 DIAGNOSIS — R609 Edema, unspecified: Secondary | ICD-10-CM | POA: Diagnosis not present

## 2024-02-04 DIAGNOSIS — S86011A Strain of right Achilles tendon, initial encounter: Secondary | ICD-10-CM | POA: Diagnosis not present

## 2024-02-04 DIAGNOSIS — M79671 Pain in right foot: Secondary | ICD-10-CM | POA: Diagnosis not present

## 2024-02-04 DIAGNOSIS — M25571 Pain in right ankle and joints of right foot: Secondary | ICD-10-CM | POA: Insufficient documentation

## 2024-02-04 DIAGNOSIS — M65971 Unspecified synovitis and tenosynovitis, right ankle and foot: Secondary | ICD-10-CM | POA: Diagnosis not present

## 2024-02-04 DIAGNOSIS — S99911A Unspecified injury of right ankle, initial encounter: Secondary | ICD-10-CM | POA: Diagnosis not present

## 2024-02-05 DIAGNOSIS — S86011A Strain of right Achilles tendon, initial encounter: Secondary | ICD-10-CM | POA: Diagnosis not present

## 2024-02-06 ENCOUNTER — Other Ambulatory Visit (HOSPITAL_COMMUNITY)

## 2024-02-08 DIAGNOSIS — X58XXXA Exposure to other specified factors, initial encounter: Secondary | ICD-10-CM | POA: Diagnosis not present

## 2024-02-08 DIAGNOSIS — S86011A Strain of right Achilles tendon, initial encounter: Secondary | ICD-10-CM | POA: Diagnosis not present

## 2024-02-08 DIAGNOSIS — G8918 Other acute postprocedural pain: Secondary | ICD-10-CM | POA: Diagnosis not present

## 2024-02-08 DIAGNOSIS — Y999 Unspecified external cause status: Secondary | ICD-10-CM | POA: Diagnosis not present

## 2024-02-18 DIAGNOSIS — S86011D Strain of right Achilles tendon, subsequent encounter: Secondary | ICD-10-CM | POA: Diagnosis not present

## 2024-03-04 DIAGNOSIS — S86011A Strain of right Achilles tendon, initial encounter: Secondary | ICD-10-CM | POA: Diagnosis not present

## 2024-03-30 DIAGNOSIS — M25671 Stiffness of right ankle, not elsewhere classified: Secondary | ICD-10-CM | POA: Diagnosis not present

## 2024-03-30 DIAGNOSIS — M25571 Pain in right ankle and joints of right foot: Secondary | ICD-10-CM | POA: Diagnosis not present

## 2024-03-30 DIAGNOSIS — R29898 Other symptoms and signs involving the musculoskeletal system: Secondary | ICD-10-CM | POA: Diagnosis not present

## 2024-04-08 DIAGNOSIS — R29898 Other symptoms and signs involving the musculoskeletal system: Secondary | ICD-10-CM | POA: Diagnosis not present

## 2024-04-08 DIAGNOSIS — M25571 Pain in right ankle and joints of right foot: Secondary | ICD-10-CM | POA: Diagnosis not present

## 2024-04-08 DIAGNOSIS — M25671 Stiffness of right ankle, not elsewhere classified: Secondary | ICD-10-CM | POA: Diagnosis not present

## 2024-04-19 DIAGNOSIS — M25571 Pain in right ankle and joints of right foot: Secondary | ICD-10-CM | POA: Diagnosis not present

## 2024-04-19 DIAGNOSIS — R29898 Other symptoms and signs involving the musculoskeletal system: Secondary | ICD-10-CM | POA: Diagnosis not present

## 2024-04-19 DIAGNOSIS — M25671 Stiffness of right ankle, not elsewhere classified: Secondary | ICD-10-CM | POA: Diagnosis not present

## 2024-04-27 DIAGNOSIS — M25671 Stiffness of right ankle, not elsewhere classified: Secondary | ICD-10-CM | POA: Diagnosis not present

## 2024-04-27 DIAGNOSIS — M25571 Pain in right ankle and joints of right foot: Secondary | ICD-10-CM | POA: Diagnosis not present

## 2024-04-27 DIAGNOSIS — R29898 Other symptoms and signs involving the musculoskeletal system: Secondary | ICD-10-CM | POA: Diagnosis not present

## 2024-04-29 DIAGNOSIS — M25671 Stiffness of right ankle, not elsewhere classified: Secondary | ICD-10-CM | POA: Diagnosis not present

## 2024-04-29 DIAGNOSIS — M25571 Pain in right ankle and joints of right foot: Secondary | ICD-10-CM | POA: Diagnosis not present

## 2024-04-29 DIAGNOSIS — R29898 Other symptoms and signs involving the musculoskeletal system: Secondary | ICD-10-CM | POA: Diagnosis not present

## 2024-05-02 DIAGNOSIS — M25571 Pain in right ankle and joints of right foot: Secondary | ICD-10-CM | POA: Diagnosis not present

## 2024-05-02 DIAGNOSIS — M25671 Stiffness of right ankle, not elsewhere classified: Secondary | ICD-10-CM | POA: Diagnosis not present

## 2024-05-02 DIAGNOSIS — R29898 Other symptoms and signs involving the musculoskeletal system: Secondary | ICD-10-CM | POA: Diagnosis not present

## 2024-05-09 DIAGNOSIS — M25571 Pain in right ankle and joints of right foot: Secondary | ICD-10-CM | POA: Diagnosis not present

## 2024-05-09 DIAGNOSIS — R29898 Other symptoms and signs involving the musculoskeletal system: Secondary | ICD-10-CM | POA: Diagnosis not present

## 2024-05-09 DIAGNOSIS — M25671 Stiffness of right ankle, not elsewhere classified: Secondary | ICD-10-CM | POA: Diagnosis not present

## 2024-05-12 DIAGNOSIS — R29898 Other symptoms and signs involving the musculoskeletal system: Secondary | ICD-10-CM | POA: Diagnosis not present

## 2024-05-12 DIAGNOSIS — M25571 Pain in right ankle and joints of right foot: Secondary | ICD-10-CM | POA: Diagnosis not present

## 2024-05-12 DIAGNOSIS — M25671 Stiffness of right ankle, not elsewhere classified: Secondary | ICD-10-CM | POA: Diagnosis not present

## 2024-05-13 DIAGNOSIS — N1831 Chronic kidney disease, stage 3a: Secondary | ICD-10-CM | POA: Diagnosis not present

## 2024-05-13 DIAGNOSIS — Z1212 Encounter for screening for malignant neoplasm of rectum: Secondary | ICD-10-CM | POA: Diagnosis not present

## 2024-05-13 DIAGNOSIS — E785 Hyperlipidemia, unspecified: Secondary | ICD-10-CM | POA: Diagnosis not present

## 2024-05-13 DIAGNOSIS — K219 Gastro-esophageal reflux disease without esophagitis: Secondary | ICD-10-CM | POA: Diagnosis not present

## 2024-05-13 DIAGNOSIS — I129 Hypertensive chronic kidney disease with stage 1 through stage 4 chronic kidney disease, or unspecified chronic kidney disease: Secondary | ICD-10-CM | POA: Diagnosis not present

## 2024-05-13 DIAGNOSIS — E1129 Type 2 diabetes mellitus with other diabetic kidney complication: Secondary | ICD-10-CM | POA: Diagnosis not present

## 2024-05-13 DIAGNOSIS — N400 Enlarged prostate without lower urinary tract symptoms: Secondary | ICD-10-CM | POA: Diagnosis not present

## 2024-05-13 DIAGNOSIS — D509 Iron deficiency anemia, unspecified: Secondary | ICD-10-CM | POA: Diagnosis not present

## 2024-05-13 DIAGNOSIS — E7849 Other hyperlipidemia: Secondary | ICD-10-CM | POA: Diagnosis not present

## 2024-05-20 DIAGNOSIS — I129 Hypertensive chronic kidney disease with stage 1 through stage 4 chronic kidney disease, or unspecified chronic kidney disease: Secondary | ICD-10-CM | POA: Diagnosis not present

## 2024-05-20 DIAGNOSIS — M1990 Unspecified osteoarthritis, unspecified site: Secondary | ICD-10-CM | POA: Diagnosis not present

## 2024-05-20 DIAGNOSIS — J45909 Unspecified asthma, uncomplicated: Secondary | ICD-10-CM | POA: Diagnosis not present

## 2024-05-20 DIAGNOSIS — D509 Iron deficiency anemia, unspecified: Secondary | ICD-10-CM | POA: Diagnosis not present

## 2024-05-20 DIAGNOSIS — F909 Attention-deficit hyperactivity disorder, unspecified type: Secondary | ICD-10-CM | POA: Diagnosis not present

## 2024-05-20 DIAGNOSIS — E785 Hyperlipidemia, unspecified: Secondary | ICD-10-CM | POA: Diagnosis not present

## 2024-05-20 DIAGNOSIS — N1831 Chronic kidney disease, stage 3a: Secondary | ICD-10-CM | POA: Diagnosis not present

## 2024-05-20 DIAGNOSIS — Z0000 Encounter for general adult medical examination without abnormal findings: Secondary | ICD-10-CM | POA: Diagnosis not present

## 2024-05-20 DIAGNOSIS — N400 Enlarged prostate without lower urinary tract symptoms: Secondary | ICD-10-CM | POA: Diagnosis not present

## 2024-05-20 DIAGNOSIS — M7660 Achilles tendinitis, unspecified leg: Secondary | ICD-10-CM | POA: Diagnosis not present

## 2024-05-20 DIAGNOSIS — G720 Drug-induced myopathy: Secondary | ICD-10-CM | POA: Diagnosis not present

## 2024-05-20 DIAGNOSIS — R82998 Other abnormal findings in urine: Secondary | ICD-10-CM | POA: Diagnosis not present

## 2024-05-20 DIAGNOSIS — I493 Ventricular premature depolarization: Secondary | ICD-10-CM | POA: Diagnosis not present

## 2024-05-20 DIAGNOSIS — Z1339 Encounter for screening examination for other mental health and behavioral disorders: Secondary | ICD-10-CM | POA: Diagnosis not present

## 2024-05-20 DIAGNOSIS — Z1331 Encounter for screening for depression: Secondary | ICD-10-CM | POA: Diagnosis not present

## 2024-05-20 DIAGNOSIS — E1122 Type 2 diabetes mellitus with diabetic chronic kidney disease: Secondary | ICD-10-CM | POA: Diagnosis not present

## 2024-05-26 DIAGNOSIS — S86011A Strain of right Achilles tendon, initial encounter: Secondary | ICD-10-CM | POA: Diagnosis not present

## 2024-06-14 DIAGNOSIS — R1032 Left lower quadrant pain: Secondary | ICD-10-CM | POA: Diagnosis not present

## 2024-06-14 DIAGNOSIS — M7660 Achilles tendinitis, unspecified leg: Secondary | ICD-10-CM | POA: Diagnosis not present

## 2024-06-14 DIAGNOSIS — K4090 Unilateral inguinal hernia, without obstruction or gangrene, not specified as recurrent: Secondary | ICD-10-CM | POA: Diagnosis not present

## 2024-08-01 DIAGNOSIS — N1831 Chronic kidney disease, stage 3a: Secondary | ICD-10-CM | POA: Diagnosis not present

## 2024-08-01 DIAGNOSIS — E7800 Pure hypercholesterolemia, unspecified: Secondary | ICD-10-CM | POA: Diagnosis not present

## 2024-08-01 DIAGNOSIS — I10 Essential (primary) hypertension: Secondary | ICD-10-CM | POA: Diagnosis not present

## 2024-08-01 DIAGNOSIS — E1165 Type 2 diabetes mellitus with hyperglycemia: Secondary | ICD-10-CM | POA: Diagnosis not present

## 2024-08-10 DIAGNOSIS — K219 Gastro-esophageal reflux disease without esophagitis: Secondary | ICD-10-CM | POA: Diagnosis not present

## 2024-08-10 DIAGNOSIS — N1831 Chronic kidney disease, stage 3a: Secondary | ICD-10-CM | POA: Diagnosis not present

## 2024-08-10 DIAGNOSIS — Z794 Long term (current) use of insulin: Secondary | ICD-10-CM | POA: Diagnosis not present

## 2024-08-10 DIAGNOSIS — K4090 Unilateral inguinal hernia, without obstruction or gangrene, not specified as recurrent: Secondary | ICD-10-CM | POA: Diagnosis not present

## 2024-08-10 DIAGNOSIS — E119 Type 2 diabetes mellitus without complications: Secondary | ICD-10-CM | POA: Diagnosis not present

## 2024-08-10 DIAGNOSIS — N400 Enlarged prostate without lower urinary tract symptoms: Secondary | ICD-10-CM | POA: Diagnosis not present

## 2024-08-15 ENCOUNTER — Encounter: Payer: Self-pay | Admitting: Radiology

## 2024-08-31 DIAGNOSIS — E1165 Type 2 diabetes mellitus with hyperglycemia: Secondary | ICD-10-CM | POA: Diagnosis not present

## 2024-08-31 DIAGNOSIS — E7800 Pure hypercholesterolemia, unspecified: Secondary | ICD-10-CM | POA: Diagnosis not present

## 2024-08-31 DIAGNOSIS — I10 Essential (primary) hypertension: Secondary | ICD-10-CM | POA: Diagnosis not present

## 2024-08-31 DIAGNOSIS — N1831 Chronic kidney disease, stage 3a: Secondary | ICD-10-CM | POA: Diagnosis not present

## 2024-09-29 NOTE — Progress Notes (Signed)
 Allen Hartman.                                          MRN: [id number]   09/29/2024   The [facility] Quality Team Specialist reviewed this patient medical record for the purposes of chart review for care gap closure. The following were reviewed: chart review for care gap closure-kidney health evaluation for diabetes:eGFR  and uACR.    [facility] Quality Team
